# Patient Record
Sex: Female | Born: 1970
Health system: Southern US, Community
[De-identification: ages and names within clinical notes are randomized; demographics above are authoritative.]

## PROBLEM LIST (undated history)

## (undated) DIAGNOSIS — F419 Anxiety disorder, unspecified: Secondary | ICD-10-CM

## (undated) DIAGNOSIS — A630 Anogenital (venereal) warts: Secondary | ICD-10-CM

## (undated) DIAGNOSIS — R319 Hematuria, unspecified: Secondary | ICD-10-CM

## (undated) DIAGNOSIS — I2699 Other pulmonary embolism without acute cor pulmonale: Secondary | ICD-10-CM

## (undated) DIAGNOSIS — F32A Depression, unspecified: Secondary | ICD-10-CM

## (undated) DIAGNOSIS — F329 Major depressive disorder, single episode, unspecified: Secondary | ICD-10-CM

## (undated) HISTORY — PX: RIGHT OOPHORECTOMY: SHX2359

## (undated) HISTORY — PX: SALPINGECTOMY: SHX328

## (undated) HISTORY — DX: Anogenital (venereal) warts: A63.0

## (undated) HISTORY — PX: ABDOMINAL HYSTERECTOMY: SHX81

## (undated) HISTORY — DX: Major depressive disorder, single episode, unspecified: F32.9

## (undated) HISTORY — DX: Depression, unspecified: F32.A

## (undated) HISTORY — DX: Hematuria, unspecified: R31.9

## (undated) HISTORY — DX: Anxiety disorder, unspecified: F41.9

## (undated) HISTORY — DX: Other pulmonary embolism without acute cor pulmonale: I26.99

---
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
## 2005-04-04 HISTORY — PX: ABLATION ON ENDOMETRIOSIS: SHX5787

## 2005-06-03 ENCOUNTER — Ambulatory Visit: Payer: Self-pay | Admitting: Internal Medicine

## 2006-03-20 ENCOUNTER — Ambulatory Visit: Payer: Self-pay | Admitting: Obstetrics and Gynecology

## 2007-08-23 ENCOUNTER — Ambulatory Visit: Payer: Self-pay

## 2012-05-01 ENCOUNTER — Emergency Department: Payer: Self-pay | Admitting: Emergency Medicine

## 2012-05-01 LAB — CBC
HCT: 38.7 % (ref 35.0–47.0)
HGB: 13.3 g/dL (ref 12.0–16.0)
MCH: 33.9 pg (ref 26.0–34.0)
MCHC: 34.3 g/dL (ref 32.0–36.0)
MCV: 99 fL (ref 80–100)
Platelet: 319 10*3/uL (ref 150–440)
RBC: 3.93 10*6/uL (ref 3.80–5.20)
RDW: 13.7 % (ref 11.5–14.5)
WBC: 10.1 10*3/uL (ref 3.6–11.0)

## 2012-05-01 LAB — URINALYSIS, COMPLETE
Bilirubin,UR: NEGATIVE
Glucose,UR: NEGATIVE mg/dL (ref 0–75)
Ketone: NEGATIVE
Leukocyte Esterase: NEGATIVE
Nitrite: NEGATIVE
Ph: 6 (ref 4.5–8.0)
Protein: NEGATIVE
RBC,UR: 2 /HPF (ref 0–5)
Specific Gravity: 1.015 (ref 1.003–1.030)
Squamous Epithelial: 3
WBC UR: 2 /HPF (ref 0–5)

## 2012-05-01 LAB — COMPREHENSIVE METABOLIC PANEL
Albumin: 3.9 g/dL (ref 3.4–5.0)
Alkaline Phosphatase: 65 U/L (ref 50–136)
Anion Gap: 5 — ABNORMAL LOW (ref 7–16)
BUN: 9 mg/dL (ref 7–18)
Bilirubin,Total: 0.3 mg/dL (ref 0.2–1.0)
Calcium, Total: 8.7 mg/dL (ref 8.5–10.1)
Chloride: 109 mmol/L — ABNORMAL HIGH (ref 98–107)
Co2: 26 mmol/L (ref 21–32)
Creatinine: 0.67 mg/dL (ref 0.60–1.30)
EGFR (African American): >60
EGFR (Non-African Amer.): >60
Glucose: 95 mg/dL (ref 65–99)
Osmolality: 278 (ref 275–301)
Potassium: 3.7 mmol/L (ref 3.5–5.1)
SGOT(AST): 16 U/L (ref 15–37)
SGPT (ALT): 23 U/L (ref 12–78)
Sodium: 140 mmol/L (ref 136–145)
Total Protein: 7.2 g/dL (ref 6.4–8.2)

## 2012-05-01 LAB — DIFFERENTIAL
Basophil #: 0.1 10*3/uL (ref 0.0–0.1)
Basophil %: 0.5 %
Eosinophil #: 0.2 10*3/uL (ref 0.0–0.7)
Eosinophil %: 2.1 %
Lymphocyte #: 1.5 10*3/uL (ref 1.0–3.6)
Lymphocyte %: 14.8 %
Monocyte #: 0.7 x10 3/mm (ref 0.2–0.9)
Monocyte %: 7 %
Neutrophil #: 7.6 10*3/uL — ABNORMAL HIGH (ref 1.4–6.5)
Neutrophil %: 75.6 %

## 2012-05-01 LAB — HCG, QUANTITATIVE, PREGNANCY: Beta Hcg, Quant.: <1 m[IU]/mL — ABNORMAL LOW

## 2012-05-01 LAB — LIPASE, BLOOD: Lipase: 185 U/L (ref 73–393)

## 2012-10-04 ENCOUNTER — Ambulatory Visit: Payer: Self-pay | Admitting: Obstetrics and Gynecology

## 2012-10-04 LAB — BASIC METABOLIC PANEL
Anion Gap: 4 — ABNORMAL LOW (ref 7–16)
BUN: 16 mg/dL (ref 7–18)
Calcium, Total: 8.5 mg/dL (ref 8.5–10.1)
Chloride: 107 mmol/L (ref 98–107)
Co2: 28 mmol/L (ref 21–32)
Creatinine: 0.7 mg/dL (ref 0.60–1.30)
EGFR (African American): >60
EGFR (Non-African Amer.): >60
Glucose: 95 mg/dL (ref 65–99)
Osmolality: 279 (ref 275–301)
Potassium: 4.7 mmol/L (ref 3.5–5.1)
Sodium: 139 mmol/L (ref 136–145)

## 2012-10-04 LAB — CBC
HCT: 37.3 % (ref 35.0–47.0)
HGB: 13.2 g/dL (ref 12.0–16.0)
MCH: 34.3 pg — ABNORMAL HIGH (ref 26.0–34.0)
MCHC: 35.4 g/dL (ref 32.0–36.0)
MCV: 97 fL (ref 80–100)
Platelet: 240 10*3/uL (ref 150–440)
RBC: 3.84 10*6/uL (ref 3.80–5.20)
RDW: 13.9 % (ref 11.5–14.5)
WBC: 4.4 10*3/uL (ref 3.6–11.0)

## 2012-10-08 ENCOUNTER — Ambulatory Visit: Payer: Self-pay | Admitting: Obstetrics and Gynecology

## 2012-10-08 LAB — PROTIME-INR
INR: 1.1
Prothrombin Time: 14.2 secs (ref 11.5–14.7)

## 2012-10-10 LAB — PATHOLOGY REPORT

## 2013-06-04 ENCOUNTER — Emergency Department: Payer: Self-pay | Admitting: Emergency Medicine

## 2013-06-04 LAB — COMPREHENSIVE METABOLIC PANEL
Albumin: 4.1 g/dL (ref 3.4–5.0)
Alkaline Phosphatase: 68 U/L
Anion Gap: 4 — ABNORMAL LOW (ref 7–16)
BUN: 10 mg/dL (ref 7–18)
Bilirubin,Total: 0.3 mg/dL (ref 0.2–1.0)
Calcium, Total: 8.7 mg/dL (ref 8.5–10.1)
Chloride: 101 mmol/L (ref 98–107)
Co2: 30 mmol/L (ref 21–32)
Creatinine: 0.66 mg/dL (ref 0.60–1.30)
EGFR (African American): >60
EGFR (Non-African Amer.): >60
Glucose: 111 mg/dL — ABNORMAL HIGH (ref 65–99)
Osmolality: 270 (ref 275–301)
Potassium: 3.6 mmol/L (ref 3.5–5.1)
SGOT(AST): 27 U/L (ref 15–37)
SGPT (ALT): 32 U/L (ref 12–78)
Sodium: 135 mmol/L — ABNORMAL LOW (ref 136–145)
Total Protein: 7.6 g/dL (ref 6.4–8.2)

## 2013-06-04 LAB — CBC
HCT: 41.8 % (ref 35.0–47.0)
HGB: 14.2 g/dL (ref 12.0–16.0)
MCH: 34.2 pg — ABNORMAL HIGH (ref 26.0–34.0)
MCHC: 34.1 g/dL (ref 32.0–36.0)
MCV: 100 fL (ref 80–100)
Platelet: 323 10*3/uL (ref 150–440)
RBC: 4.16 10*6/uL (ref 3.80–5.20)
RDW: 13.8 % (ref 11.5–14.5)
WBC: 12.5 10*3/uL — ABNORMAL HIGH (ref 3.6–11.0)

## 2013-06-04 LAB — URINALYSIS, COMPLETE
Bilirubin,UR: NEGATIVE
Glucose,UR: NEGATIVE mg/dL (ref 0–75)
Leukocyte Esterase: NEGATIVE
Nitrite: NEGATIVE
Ph: 6 (ref 4.5–8.0)
Protein: NEGATIVE
RBC,UR: 374 /HPF (ref 0–5)
Specific Gravity: 1.015 (ref 1.003–1.030)
Squamous Epithelial: 1
WBC UR: 3 /HPF (ref 0–5)

## 2013-06-04 LAB — PROTIME-INR
INR: 1.8
Prothrombin Time: 20.3 secs — ABNORMAL HIGH (ref 11.5–14.7)

## 2014-02-06 LAB — HM PAP SMEAR: HM Pap smear: NEGATIVE

## 2014-04-04 HISTORY — PX: TOTAL ABDOMINAL HYSTERECTOMY: SHX209

## 2014-06-26 ENCOUNTER — Ambulatory Visit: Payer: Self-pay | Admitting: Obstetrics and Gynecology

## 2014-06-26 LAB — PROTIME-INR
INR: 1.3
Prothrombin Time: 16.2 secs — ABNORMAL HIGH

## 2014-06-26 LAB — CBC
HCT: 40.8 % (ref 35.0–47.0)
HGB: 13.4 g/dL (ref 12.0–16.0)
MCH: 32.2 pg (ref 26.0–34.0)
MCHC: 32.9 g/dL (ref 32.0–36.0)
MCV: 98 fL (ref 80–100)
Platelet: 265 10*3/uL (ref 150–440)
RBC: 4.16 10*6/uL (ref 3.80–5.20)
RDW: 13.9 % (ref 11.5–14.5)
WBC: 4 10*3/uL (ref 3.6–11.0)

## 2014-06-26 LAB — BASIC METABOLIC PANEL
Anion Gap: 6 — ABNORMAL LOW (ref 7–16)
BUN: 17 mg/dL
Calcium, Total: 9 mg/dL
Chloride: 106 mmol/L
Co2: 27 mmol/L
Creatinine: 0.54 mg/dL
EGFR (African American): >60
EGFR (Non-African Amer.): >60
Glucose: 88 mg/dL
Potassium: 3.8 mmol/L
Sodium: 139 mmol/L

## 2014-06-26 LAB — APTT: Activated PTT: 32.1 secs (ref 23.6–35.9)

## 2014-06-30 ENCOUNTER — Ambulatory Visit: Payer: Self-pay | Admitting: Obstetrics and Gynecology

## 2014-06-30 LAB — PROTIME-INR
INR: 1.1
Prothrombin Time: 14.7 secs

## 2014-07-01 LAB — CREATININE, SERUM
Creatinine: 0.55 mg/dL
EGFR (African American): >60
EGFR (Non-African Amer.): >60

## 2014-07-01 LAB — HEMOGLOBIN: HGB: 11 g/dL — ABNORMAL LOW (ref 12.0–16.0)

## 2014-07-25 NOTE — Op Note (Signed)
PATIENT NAME:  Latoya [name], Latoya [name] MR#:  [id number] DATE OF BIRTH:  08-08-1970  DATE OF PROCEDURE:  10/08/2012  PREOPERATIVE DIAGNOSES:  1. Chronic pelvic pain.  2. Status post tubal ligation and NovaSure endometrial ablation.  3. Abnormal uterine bleeding.   POSTOPERATIVE DIAGNOSES:  1. Chronic pelvic pain.  2. Status post tubal ligation and NovaSure endometrial ablation.  3. Abnormal uterine bleeding.  4. Suspect endometriosis.  5. Tubal ligation/ablation syndrome.   OPERATIVE PROCEDURE: Laparoscopy with peritoneal biopsies and fulguration of endometriosis.   SURGEON: Alanda Slim. DeFrancesco, M.D.   FIRST ASSISTANT: Herbert Moors, NP, and Shyrl Numbers, [place] student.   ANESTHESIA: General endotracheal.   INDICATIONS: The patient is a 44 year old, married, white female, multiparous, status post tubal ligation and NovaSure endometrial ablation who presents for evaluation of chronic pelvic pain and abnormal uterine bleeding. Workup the Emergency Room and as outpatient revealed free peritoneal fluid in the pelvis without other obvious abnormalities noted.   FINDINGS AT SURGERY: Revealed a gynecoid bony pelvis. There was a grossly normal-appearing uterus. There was proximal dilation of the fallopian tubes bilaterally, right greater than left. There was evidence of previous tubal ligation present with Falope-Rings being seen. The right tube and ovary had adhesions between it and the right pelvic sidewall; these were mobilized with blunt dissection. The left ovary was normal. The left fallopian tube had some paratubal cyst. Within the cul-de-sac there were pseudofenestrations on the right consistent with endometriosis.   DESCRIPTION OF PROCEDURE: The patient was brought to the operating room where she was placed in the supine position. General endotracheal anesthesia was induced without difficulty. She was placed in the dorsal lithotomy position using the bumblebee stirrups. A ChloraPrep and  Betadine abdominal, perineal, intravaginal prep and drape was performed in the standard fashion. A red Robinson catheter was used to drain minimal urine from the bladder. A Hulka tenaculum was placed onto the cervix to facilitate uterine manipulation. The bimanual exam did demonstrate a gynecoid bony pelvis with significant mobility of the uterus. A subumbilical vertical incision 5 mm in length was made. The Optiview laparoscopic trocar system was used to introduce the 5 mm scope into the abdomen through direct entry with no evidence of bowel or vascular injury. A second 5 mm port was placed in the suprapubic region under direct visualization. The above-noted findings were photo documented. The cul-de-sac peritoneal defect was biopsied. The right fallopian tube, proximally, was biopsied and bipolar Kleppinger cautery was used to fulgurate this area as well as in the cul-de-sac for treatment of suspected endometriosis. The pelvis was copiously irrigated. The irrigant fluid was aspirated. Once satisfied with final inspection for hemostasis and no residual peritoneal fluid being left behind, the procedure was terminated. All instrumentation was removed from the abdomen. Pneumoperitoneum was released. The incisions were closed with Dermabond glue. The patient was then awakened, extubated and taken to the recovery room in satisfactory condition.   ESTIMATED BLOOD LOSS: Minimal.   COMPLICATIONS: None.   All instrument, needle, sponge counts were verified as correct.    ____________________________ Alanda Slim. DeFrancesco, MD mad:gb D: 10/08/2012 16:27:13 ET T: 10/08/2012 22:52:30 ET JOB#: [id number]  cc: Hassell Done A. DeFrancesco, MD, <Dictator> [facility] MD ELECTRONICALLY SIGNED 10/14/2012 23:03

## 2014-07-28 LAB — SURGICAL PATHOLOGY

## 2014-08-03 NOTE — Op Note (Signed)
PATIENT NAME:  Latoya [name], Latoya [name] MR#:  [id number] DATE OF BIRTH:  1970-12-16  DATE OF PROCEDURE:  06/30/2014  PREOPERATIVE DIAGNOSES:  Symptomatic endometriosis.   POSTOPERATIVE DIAGNOSES:  1.  Symptomatic endometriosis.  2.  Pelvic adhesive disease.  3.  Bilateral ureteral efflux on cystoscopy.   OPERATIVE PROCEDURES:  1.  Laparoscopically assisted vaginal hysterectomy, right salpingo-oophorectomy and left salpingectomy.  2.  Cystoscopy.   SURGEON: Malachi Paradise, MD.    FIRST ASSISTANT: Chesley Noon. Marcelline Mates, MD.    ANESTHESIA: General endotracheal.   INDICATIONS: The patient is a 44 year old white female who presents for definitive surgery of symptomatic endometriosis. The patient is having increasing pelvic pain and abnormal bleeding. She desires definitive surgery.   FINDINGS AT SURGERY: Revealed adnexal adhesions between the tube, ovary, and uterus. The left ovary was normal. There were bladder flap adhesions to the lower uterine segment prompting difficult dissection.  There were pseudo-fenestrations  in the cul-de-sac consistent with endometriosis. The liver, gallbladder, appendix were all normal.   DESCRIPTION OF PROCEDURE: The patient was brought to the operating room where she was placed in the supine position. General endotracheal anesthesia was induced without difficulty. She was placed in the dorsal lithotomy position using the bumblebee stirrups. A ChloraPrep and Betadine abdominal, perineal, intravaginal prep and drape was performed in standard fashion. A Foley catheter was placed and was draining clear yellow urine from the bladder. A VCare uterine manipulator was inserted in standard fashion. Subumbilical incision was then made 5 mm in length. The Optiview laparoscopic trocar was used to place a port directly into the abdominopelvic cavity. Pneumoperitoneum was then created with CO2 gas. Two other 5 mm ports were placed in the lower abdomen under direct visualization. The  findings were photo documented. The abdominal aspect of the procedure was then performed. The round ligaments were clamped, coagulated, and cut. The right infundibulopelvic ligament was clamped, coagulated, and cut using the Ace Harmonic scalpel. The remainder of the mesosalpinx was likewise taken down using the Harmonic scalpel. The anterior and posterior leafs of the broad ligament were opened and dissected down to the uterosacral ligaments. The bladder flap was created in standard fashion. On the left side a left salpingectomy was performed with the Ace Harmonic scalpel with the incision line being made across the mesosalpinx. Once this was accomplished again dissection was made to identify the uterine arteries on the left side. The dissection over the bladder was complex because of adhesions likely from endometriosis. Moderate bleeding was encountered with this aspect of the procedure. Decision was then made to proceed with LAVH rather than [place] resident because of the difficult dissection. The attention was turned to the vagina and the [facility] instrument was removed from the uterus. The weighted speculum was placed into the vagina and a posterior colpotomy was made with Mayo scissors. The uterosacral ligaments were clamped, cut, and stick tied using 0 Vicryl suture. The cervix was circumscribed and the bladder was dissected off the lower uterine segment through sharp and blunt dissection. The anterior cul-de-sac was then entered. The remainder of the cardinal ligament complexes were then clamped, cut, and stick tied using 0 Vicryl suture. This ultimately mobilized the specimen, which was then removed from the vaginal cavity. The left broad ligament complex was slightly moist and a running stitch of 2-0 Vicryl was made in order to optimize hemostasis. The posterior cuff was run with 0 Vicryl in a simple running manner. This was followed by inspection of the pedicles which verified hemostasis. The  vagina was then  closed with simple interrupted sutures of 2-0 Chromic. Repeat laparoscopy was then performed to verify that the pedicle sites intra-abdominally were hemostatic. These were. The ureters were noted to have peristalsis on postoperative laparoscopy. However, because of scarring in the lower uterine segment and the bladder flap decision was made to perform cystoscopy. The 5 mm port incisions were closed with a 4-0 Vicryl suture. This was then followed by cystoscopy. A 30 degree scope was used. Lactated Ringer's was used to infuse into the bladder. Fluorescein 0.5 mg were given IV. Bilateral efflux of the left and right ureters were identified visually with normal peristalsis. No abnormalities were noted in the dome of the bladder. The procedure was then terminated. All instrumentation was removed from the vagina. A Foley catheter was replaced into the bladder to facilitate drainage. The patient was then awakened, mobilized, and taken to the recovery room in satisfactory condition.   ESTIMATED BLOOD LOSS: 500 mL.   INTRAVENOUS FLUIDS: 1800 mL.   COUNTS: All instrument, needle, and sponge counts were verified as correct.   The patient did subcutaneous DVT prophylaxis with heparin due to the patient's history of multifocal pulmonary emboli. She also received Ancef antibiotic prophylaxis.    ____________________________ Alanda Slim Darrian Goodwill, MD mad:bu D: 06/30/2014 11:16:15 ET T: 06/30/2014 21:26:32 ET JOB#: [id number]  cc: Hassell Done A. Jozlin Bently, MD, <Dictator> [facility] [place] MD ELECTRONICALLY SIGNED 07/03/2014 8:06

## 2014-10-15 ENCOUNTER — Ambulatory Visit: Payer: Self-pay | Admitting: Obstetrics and Gynecology

## 2014-10-17 ENCOUNTER — Other Ambulatory Visit: Payer: Self-pay | Admitting: Obstetrics and Gynecology

## 2014-10-17 DIAGNOSIS — N390 Urinary tract infection, site not specified: Secondary | ICD-10-CM

## 2014-10-17 MED ORDER — NITROFURANTOIN MONOHYD MACRO 100 MG PO CAPS: 100.0000 mg | ORAL_CAPSULE | Freq: Two times a day (BID) | ORAL | Status: DC

## 2014-10-17 MED ORDER — UROGESIC-BLUE 81.6 MG PO TABS: 1.0000 | ORAL_TABLET | Freq: Four times a day (QID) | ORAL | Status: DC | PRN

## 2014-10-17 NOTE — Telephone Encounter (Signed)
Pt aware I used [facility] (in [place] sys)  and I have erx the meds. Pt very appreciative.

## 2014-10-17 NOTE — Telephone Encounter (Signed)
Please let her know I printed off 2 prescriptions, one for antibiotic and the other for bladder pains- we need a pharmacy to fax to-

## 2014-10-17 NOTE — Telephone Encounter (Signed)
PT CALLED AND SHE IS IN THE BEGGING STAGES OF A UTI AND SHE CAN NOT COME IN TODAY BECAUSE OF WORK, SHE HAS DONE THE OVER THE COUNTER TEST STRIPES AND ITS SHOWING SHE IS HAVING A UTI, AND SHE HAS DRANK ABOUT A GALLON OF CRANBERRY JUICE, AND SHE IS TAKING THE AZZO, SHE IS LEAVE ING TONIGHT FOR [place] AND WANTS TO KNOW IF AN ANTIBIOTIC CAN BE CALLED IN FOR HER, SHE STATED SHE IS URINATING A LOT AND SOME BURNING.

## 2014-12-01 ENCOUNTER — Telehealth: Payer: Self-pay | Admitting: Obstetrics and Gynecology

## 2014-12-01 NOTE — Telephone Encounter (Signed)
Pt called and she needs a refill for vivance, she hasnt taken it in a week and her ADD is in over drive.

## 2014-12-02 ENCOUNTER — Other Ambulatory Visit: Payer: Self-pay | Admitting: Obstetrics and Gynecology

## 2014-12-02 MED ORDER — LISDEXAMFETAMINE DIMESYLATE 50 MG PO CAPS: 50.0000 mg | ORAL_CAPSULE | Freq: Every day | ORAL | Status: DC

## 2014-12-02 NOTE — Telephone Encounter (Signed)
Please let her know she can stop by and pick up RX

## 2014-12-02 NOTE — Telephone Encounter (Signed)
TRIED CALLING HER AND SHE DOES NOT HAVE VM SET UP.

## 2015-02-04 ENCOUNTER — Encounter: Payer: Self-pay | Admitting: *Deleted

## 2015-02-13 ENCOUNTER — Encounter: Payer: Self-pay | Admitting: Obstetrics and Gynecology

## 2015-03-06 ENCOUNTER — Other Ambulatory Visit: Payer: Self-pay | Admitting: Obstetrics and Gynecology

## 2015-03-06 MED ORDER — CEFDINIR 300 MG PO CAPS: 300.0000 mg | ORAL_CAPSULE | Freq: Two times a day (BID) | ORAL | Status: DC

## 2015-04-18 ENCOUNTER — Other Ambulatory Visit: Payer: Self-pay | Admitting: Obstetrics and Gynecology

## 2015-05-21 ENCOUNTER — Ambulatory Visit (INDEPENDENT_AMBULATORY_CARE_PROVIDER_SITE_OTHER): Payer: BLUE CROSS/BLUE SHIELD | Admitting: Obstetrics and Gynecology

## 2015-05-21 ENCOUNTER — Encounter: Payer: Self-pay | Admitting: Obstetrics and Gynecology

## 2015-05-21 VITALS — BP 133/84 | HR 90 | Ht 69.0 in | Wt 178.5 lb

## 2015-05-21 DIAGNOSIS — F988 Other specified behavioral and emotional disorders with onset usually occurring in childhood and adolescence: Secondary | ICD-10-CM

## 2015-05-21 DIAGNOSIS — A630 Anogenital (venereal) warts: Secondary | ICD-10-CM | POA: Diagnosis not present

## 2015-05-21 DIAGNOSIS — R102 Pelvic and perineal pain unspecified side: Secondary | ICD-10-CM

## 2015-05-21 DIAGNOSIS — Z86711 Personal history of pulmonary embolism: Secondary | ICD-10-CM | POA: Diagnosis not present

## 2015-05-21 DIAGNOSIS — F909 Attention-deficit hyperactivity disorder, unspecified type: Secondary | ICD-10-CM | POA: Diagnosis not present

## 2015-05-21 DIAGNOSIS — R5383 Other fatigue: Secondary | ICD-10-CM

## 2015-05-21 NOTE — Progress Notes (Signed)
Subjective:     Patient ID: Latoya [name], female   DOB: 1970-12-10, 45 y.o.   MRN: [id number]  HPI Reports gradual onset of LLQ pains c/w previous cyst and endometriosis pain, started  A few weeks ago and getting worse, now occuring daily, worse with activity. Also desires removal of rectal condyloma.  Review of Systems See above Progressive weight gain since hysterectomy and right oopherectomy last fall, with increasing exercise and improving diet- very frustrated. Also reports feeling very tired, emotional and moody, not sleeping well, and like hormones are 'off'.    Objective:   Physical Exam A&O x4  well groomed female in no distress, tearful when discussing concerns, spouse at bedside and supportive 2 small warts noted around rectum at 9 & 3 oclock- treated with TCA 80%.     Assessment:     LLQ pelvic pain-s/p hysterectomy & right ooperectomy Rectal condyloma Fatigue  weight gain Mood swings Decreased sleep quality     Plan:     TCA treatment done today Labs ordered- will return in am for lab draw Pelvic u/s and will treat accordingly. RTC 1 week  Aijalon Demuro Callender, [place]

## 2015-05-22 ENCOUNTER — Other Ambulatory Visit: Payer: BLUE CROSS/BLUE SHIELD

## 2015-05-22 ENCOUNTER — Other Ambulatory Visit: Payer: Self-pay | Admitting: Obstetrics and Gynecology

## 2015-05-23 LAB — CBC WITH DIFFERENTIAL/PLATELET
Basophils Absolute: 0.1 10*3/uL (ref 0.0–0.2)
Basos: 1 %
EOS (ABSOLUTE): 0.3 10*3/uL (ref 0.0–0.4)
Eos: 5 %
Hematocrit: 40.8 % (ref 34.0–46.6)
Hemoglobin: 13.9 g/dL (ref 11.1–15.9)
Immature Grans (Abs): 0 10*3/uL (ref 0.0–0.1)
Immature Granulocytes: 0 %
Lymphocytes Absolute: 2.7 10*3/uL (ref 0.7–3.1)
Lymphs: 54 %
MCH: 33.3 pg — ABNORMAL HIGH (ref 26.6–33.0)
MCHC: 34.1 g/dL (ref 31.5–35.7)
MCV: 98 fL — ABNORMAL HIGH (ref 79–97)
Monocytes Absolute: 0.4 10*3/uL (ref 0.1–0.9)
Monocytes: 9 %
Neutrophils Absolute: 1.5 10*3/uL (ref 1.4–7.0)
Neutrophils: 31 %
Platelets: 312 10*3/uL (ref 150–379)
RBC: 4.18 x10E6/uL (ref 3.77–5.28)
RDW: 14.7 % (ref 12.3–15.4)
WBC: 4.9 10*3/uL (ref 3.4–10.8)

## 2015-05-23 LAB — COMPREHENSIVE METABOLIC PANEL
ALT: 22 IU/L (ref 0–32)
AST: 22 IU/L (ref 0–40)
Albumin/Globulin Ratio: 1.8 (ref 1.1–2.5)
Albumin: 4.1 g/dL (ref 3.5–5.5)
Alkaline Phosphatase: 45 IU/L (ref 39–117)
BUN/Creatinine Ratio: 24 — ABNORMAL HIGH (ref 9–23)
BUN: 15 mg/dL (ref 6–24)
Bilirubin Total: 0.4 mg/dL (ref 0.0–1.2)
CO2: 24 mmol/L (ref 18–29)
Calcium: 8.6 mg/dL — ABNORMAL LOW (ref 8.7–10.2)
Chloride: 101 mmol/L (ref 96–106)
Creatinine, Ser: 0.62 mg/dL (ref 0.57–1.00)
GFR calc Af Amer: 126 mL/min/{1.73_m2} (ref >59)
GFR calc non Af Amer: 109 mL/min/{1.73_m2} (ref >59)
Globulin, Total: 2.3 g/dL (ref 1.5–4.5)
Glucose: 89 mg/dL (ref 65–99)
Potassium: 4.6 mmol/L (ref 3.5–5.2)
Sodium: 140 mmol/L (ref 134–144)
Total Protein: 6.4 g/dL (ref 6.0–8.5)

## 2015-05-23 LAB — PROTIME-INR
INR: 1.2 (ref 0.8–1.2)
Prothrombin Time: 12.6 s — ABNORMAL HIGH (ref 9.1–12.0)

## 2015-05-23 LAB — ESTRADIOL: Estradiol: <5 pg/mL

## 2015-05-23 LAB — PROGESTERONE: Progesterone: 0.4 ng/mL

## 2015-05-23 LAB — THYROID PANEL WITH TSH
Free Thyroxine Index: 1.7 (ref 1.2–4.9)
T3 Uptake Ratio: 33 % (ref 24–39)
T4, Total: 5.2 ug/dL (ref 4.5–12.0)
TSH: 3.23 u[IU]/mL (ref 0.450–4.500)

## 2015-05-26 ENCOUNTER — Ambulatory Visit (INDEPENDENT_AMBULATORY_CARE_PROVIDER_SITE_OTHER): Payer: BLUE CROSS/BLUE SHIELD | Admitting: Obstetrics and Gynecology

## 2015-05-26 ENCOUNTER — Other Ambulatory Visit: Payer: BLUE CROSS/BLUE SHIELD

## 2015-05-26 ENCOUNTER — Encounter: Payer: Self-pay | Admitting: Obstetrics and Gynecology

## 2015-05-26 ENCOUNTER — Ambulatory Visit (INDEPENDENT_AMBULATORY_CARE_PROVIDER_SITE_OTHER): Payer: BLUE CROSS/BLUE SHIELD

## 2015-05-26 VITALS — BP 128/65 | HR 85 | Ht 69.0 in | Wt 178.0 lb

## 2015-05-26 DIAGNOSIS — R635 Abnormal weight gain: Secondary | ICD-10-CM

## 2015-05-26 DIAGNOSIS — F419 Anxiety disorder, unspecified: Secondary | ICD-10-CM | POA: Insufficient documentation

## 2015-05-26 DIAGNOSIS — I2699 Other pulmonary embolism without acute cor pulmonale: Secondary | ICD-10-CM | POA: Insufficient documentation

## 2015-05-26 DIAGNOSIS — F32A Depression, unspecified: Secondary | ICD-10-CM | POA: Insufficient documentation

## 2015-05-26 DIAGNOSIS — F329 Major depressive disorder, single episode, unspecified: Secondary | ICD-10-CM | POA: Insufficient documentation

## 2015-05-26 DIAGNOSIS — R102 Pelvic and perineal pain: Secondary | ICD-10-CM

## 2015-05-26 DIAGNOSIS — E663 Overweight: Secondary | ICD-10-CM | POA: Diagnosis not present

## 2015-05-26 MED ORDER — PHENTERMINE HCL 37.5 MG PO TABS: 37.5000 mg | ORAL_TABLET | Freq: Every day | ORAL | Status: DC

## 2015-05-26 MED ORDER — CYANOCOBALAMIN 1000 MCG/ML IJ SOLN: 1000.0000 ug | Freq: Once | INTRAMUSCULAR | Status: DC

## 2015-05-26 MED ORDER — LISDEXAMFETAMINE DIMESYLATE 50 MG PO CAPS: 50.0000 mg | ORAL_CAPSULE | Freq: Every day | ORAL | Status: DC

## 2015-05-26 NOTE — Progress Notes (Signed)
Patient ID: CALYSSA YARDLEY, female   DOB: 1970/09/17, 45 y.o.   MRN: [id number]  Here to review labs and ultrasound findings. Current Outpatient Prescriptions on File Prior to Visit  Medication Sig Dispense Refill  . venlafaxine XR (EFFEXOR-XR) 150 MG 24 hr capsule Take 150 mg by mouth daily with breakfast.    . warfarin (COUMADIN) 10 MG tablet TAKE ONE TABLET BY MOUTH ONCE DAILY 30 tablet 0  . cefdinir (OMNICEF) 300 MG capsule Take 1 capsule (300 mg total) by mouth 2 (two) times daily. (Patient not taking: Reported on 05/21/2015) 14 capsule 1  . Methen-Hyosc-Meth Blue-Na Phos (UROGESIC-BLUE) 81.6 MG TABS Take 1 tablet (81.6 mg total) by mouth 4 (four) times daily as needed. (Patient not taking: Reported on 05/21/2015) 30 tablet 1  . nitrofurantoin, macrocrystal-monohydrate, (MACROBID) 100 MG capsule Take 1 capsule (100 mg total) by mouth 2 (two) times daily. (Patient not taking: Reported on 05/21/2015) 14 capsule 1   No current facility-administered medications on file prior to visit.  S/O;  Blood pressure 128/65, pulse 85, height 5\' 9"  (1.753 m), weight 178 lb (80.74 kg). Reports less pelvic pain than last week, did feel slight soreness during ultrasound.  Indications:Pelvic Pain Findings:  The uterus measures is surgically absent.  Right Ovary is surgically absent Left Ovary measures 3.3 x 1.6 x 2.4 cm. It is normal appearance. Survey of the adnexa demonstrates no adnexal masses. There is no free fluid in the cul de sac.  A: pelvic pain of unknown etiology, suspect adhesions Overweight Fatigue Coumadin therapy due to previous PE  P: reassured no know ovarian etiology, and discussed scar tissue and adhesions. Desires weight loss program- started on adipex and B12- RTC in 4 weeks and to continue exercise, but increase water intake. To contact PCP about INR and PTT levels for adjustment in coumadin dose.   Nikoletta Varma Garwood, CNM

## 2015-06-22 ENCOUNTER — Ambulatory Visit (INDEPENDENT_AMBULATORY_CARE_PROVIDER_SITE_OTHER): Payer: BLUE CROSS/BLUE SHIELD | Admitting: Obstetrics and Gynecology

## 2015-06-22 VITALS — BP 128/78 | HR 102 | Wt 176.0 lb

## 2015-06-22 DIAGNOSIS — E669 Obesity, unspecified: Secondary | ICD-10-CM

## 2015-06-22 MED ORDER — CYANOCOBALAMIN 1000 MCG/ML IJ SOLN
1000.0000 ug | Freq: Once | INTRAMUSCULAR | Status: AC
  Administered 2015-06-22: 1000 ug via INTRAMUSCULAR

## 2015-06-22 NOTE — Progress Notes (Signed)
Pt is here for wt, bp check,b-12 inj She states she is working out with trainer and doing boot camp 3 x a week, states medication gives her dry mouth, she has increased her water intake, slightly frustrated about not losing but 2lbs Advised pt she is gaining muscle with all the working out she is doing   05/26/15 wt- 178lb

## 2015-07-08 DIAGNOSIS — J1089 Influenza due to other identified influenza virus with other manifestations: Secondary | ICD-10-CM | POA: Diagnosis not present

## 2015-07-08 DIAGNOSIS — J069 Acute upper respiratory infection, unspecified: Secondary | ICD-10-CM | POA: Diagnosis not present

## 2015-07-08 DIAGNOSIS — R000 Tachycardia, unspecified: Secondary | ICD-10-CM | POA: Diagnosis not present

## 2015-07-08 DIAGNOSIS — J020 Streptococcal pharyngitis: Secondary | ICD-10-CM | POA: Diagnosis not present

## 2015-07-20 ENCOUNTER — Ambulatory Visit: Payer: BLUE CROSS/BLUE SHIELD

## 2015-07-21 ENCOUNTER — Ambulatory Visit (INDEPENDENT_AMBULATORY_CARE_PROVIDER_SITE_OTHER): Payer: BLUE CROSS/BLUE SHIELD | Admitting: Obstetrics and Gynecology

## 2015-07-21 VITALS — BP 120/82 | HR 94 | Wt 172.4 lb

## 2015-07-21 DIAGNOSIS — E669 Obesity, unspecified: Secondary | ICD-10-CM | POA: Diagnosis not present

## 2015-07-21 MED ORDER — CYANOCOBALAMIN 1000 MCG/ML IJ SOLN
1000.0000 ug | Freq: Once | INTRAMUSCULAR | Status: AC
  Administered 2015-07-21: 1000 ug via INTRAMUSCULAR

## 2015-07-21 NOTE — Progress Notes (Signed)
Patient ID: Latoya [name], female   DOB: September 08, 1970, 45 y.o.   MRN: [id number] Pt presents for weight, B/P, B-12 injection. No side effects of medication-Phentermine, or B-12.  Weight loss of  3 lbs. Encouraged eating healthy and exercise.

## 2015-08-30 ENCOUNTER — Other Ambulatory Visit: Payer: Self-pay | Admitting: Obstetrics and Gynecology

## 2015-09-27 ENCOUNTER — Other Ambulatory Visit: Payer: Self-pay | Admitting: Obstetrics and Gynecology

## 2015-09-30 DIAGNOSIS — M791 Myalgia: Secondary | ICD-10-CM | POA: Diagnosis not present

## 2015-09-30 DIAGNOSIS — M9905 Segmental and somatic dysfunction of pelvic region: Secondary | ICD-10-CM | POA: Diagnosis not present

## 2015-09-30 DIAGNOSIS — M5430 Sciatica, unspecified side: Secondary | ICD-10-CM | POA: Diagnosis not present

## 2015-09-30 DIAGNOSIS — M9901 Segmental and somatic dysfunction of cervical region: Secondary | ICD-10-CM | POA: Diagnosis not present

## 2015-10-02 DIAGNOSIS — M791 Myalgia: Secondary | ICD-10-CM | POA: Diagnosis not present

## 2015-10-02 DIAGNOSIS — M9901 Segmental and somatic dysfunction of cervical region: Secondary | ICD-10-CM | POA: Diagnosis not present

## 2015-10-02 DIAGNOSIS — M722 Plantar fascial fibromatosis: Secondary | ICD-10-CM | POA: Diagnosis not present

## 2015-10-02 DIAGNOSIS — M9905 Segmental and somatic dysfunction of pelvic region: Secondary | ICD-10-CM | POA: Diagnosis not present

## 2015-10-14 DIAGNOSIS — M722 Plantar fascial fibromatosis: Secondary | ICD-10-CM | POA: Diagnosis not present

## 2015-10-14 DIAGNOSIS — M9901 Segmental and somatic dysfunction of cervical region: Secondary | ICD-10-CM | POA: Diagnosis not present

## 2015-10-14 DIAGNOSIS — M9905 Segmental and somatic dysfunction of pelvic region: Secondary | ICD-10-CM | POA: Diagnosis not present

## 2015-10-14 DIAGNOSIS — M791 Myalgia: Secondary | ICD-10-CM | POA: Diagnosis not present

## 2015-10-16 DIAGNOSIS — M9905 Segmental and somatic dysfunction of pelvic region: Secondary | ICD-10-CM | POA: Diagnosis not present

## 2015-10-16 DIAGNOSIS — M722 Plantar fascial fibromatosis: Secondary | ICD-10-CM | POA: Diagnosis not present

## 2015-10-16 DIAGNOSIS — M9901 Segmental and somatic dysfunction of cervical region: Secondary | ICD-10-CM | POA: Diagnosis not present

## 2015-10-16 DIAGNOSIS — M791 Myalgia: Secondary | ICD-10-CM | POA: Diagnosis not present

## 2015-10-19 DIAGNOSIS — M791 Myalgia: Secondary | ICD-10-CM | POA: Diagnosis not present

## 2015-10-19 DIAGNOSIS — M722 Plantar fascial fibromatosis: Secondary | ICD-10-CM | POA: Diagnosis not present

## 2015-10-19 DIAGNOSIS — M9905 Segmental and somatic dysfunction of pelvic region: Secondary | ICD-10-CM | POA: Diagnosis not present

## 2015-10-19 DIAGNOSIS — M9901 Segmental and somatic dysfunction of cervical region: Secondary | ICD-10-CM | POA: Diagnosis not present

## 2015-10-21 DIAGNOSIS — M722 Plantar fascial fibromatosis: Secondary | ICD-10-CM | POA: Diagnosis not present

## 2015-10-21 DIAGNOSIS — M9905 Segmental and somatic dysfunction of pelvic region: Secondary | ICD-10-CM | POA: Diagnosis not present

## 2015-10-21 DIAGNOSIS — M9901 Segmental and somatic dysfunction of cervical region: Secondary | ICD-10-CM | POA: Diagnosis not present

## 2015-10-21 DIAGNOSIS — M791 Myalgia: Secondary | ICD-10-CM | POA: Diagnosis not present

## 2015-10-23 DIAGNOSIS — M9901 Segmental and somatic dysfunction of cervical region: Secondary | ICD-10-CM | POA: Diagnosis not present

## 2015-10-23 DIAGNOSIS — M9905 Segmental and somatic dysfunction of pelvic region: Secondary | ICD-10-CM | POA: Diagnosis not present

## 2015-10-23 DIAGNOSIS — M722 Plantar fascial fibromatosis: Secondary | ICD-10-CM | POA: Diagnosis not present

## 2015-10-23 DIAGNOSIS — M791 Myalgia: Secondary | ICD-10-CM | POA: Diagnosis not present

## 2015-10-26 DIAGNOSIS — M9901 Segmental and somatic dysfunction of cervical region: Secondary | ICD-10-CM | POA: Diagnosis not present

## 2015-10-26 DIAGNOSIS — M791 Myalgia: Secondary | ICD-10-CM | POA: Diagnosis not present

## 2015-10-26 DIAGNOSIS — M722 Plantar fascial fibromatosis: Secondary | ICD-10-CM | POA: Diagnosis not present

## 2015-10-26 DIAGNOSIS — M9905 Segmental and somatic dysfunction of pelvic region: Secondary | ICD-10-CM | POA: Diagnosis not present

## 2015-11-02 DIAGNOSIS — M791 Myalgia: Secondary | ICD-10-CM | POA: Diagnosis not present

## 2015-11-02 DIAGNOSIS — M9905 Segmental and somatic dysfunction of pelvic region: Secondary | ICD-10-CM | POA: Diagnosis not present

## 2015-11-02 DIAGNOSIS — M9901 Segmental and somatic dysfunction of cervical region: Secondary | ICD-10-CM | POA: Diagnosis not present

## 2015-11-02 DIAGNOSIS — M722 Plantar fascial fibromatosis: Secondary | ICD-10-CM | POA: Diagnosis not present

## 2015-11-04 DIAGNOSIS — M9905 Segmental and somatic dysfunction of pelvic region: Secondary | ICD-10-CM | POA: Diagnosis not present

## 2015-11-04 DIAGNOSIS — M791 Myalgia: Secondary | ICD-10-CM | POA: Diagnosis not present

## 2015-11-04 DIAGNOSIS — M9901 Segmental and somatic dysfunction of cervical region: Secondary | ICD-10-CM | POA: Diagnosis not present

## 2015-11-04 DIAGNOSIS — M722 Plantar fascial fibromatosis: Secondary | ICD-10-CM | POA: Diagnosis not present

## 2015-11-06 DIAGNOSIS — M722 Plantar fascial fibromatosis: Secondary | ICD-10-CM | POA: Diagnosis not present

## 2015-11-06 DIAGNOSIS — M9905 Segmental and somatic dysfunction of pelvic region: Secondary | ICD-10-CM | POA: Diagnosis not present

## 2015-11-06 DIAGNOSIS — M791 Myalgia: Secondary | ICD-10-CM | POA: Diagnosis not present

## 2015-11-06 DIAGNOSIS — M9901 Segmental and somatic dysfunction of cervical region: Secondary | ICD-10-CM | POA: Diagnosis not present

## 2015-11-09 DIAGNOSIS — M722 Plantar fascial fibromatosis: Secondary | ICD-10-CM | POA: Diagnosis not present

## 2015-11-09 DIAGNOSIS — M9905 Segmental and somatic dysfunction of pelvic region: Secondary | ICD-10-CM | POA: Diagnosis not present

## 2015-11-09 DIAGNOSIS — M791 Myalgia: Secondary | ICD-10-CM | POA: Diagnosis not present

## 2015-11-09 DIAGNOSIS — M9901 Segmental and somatic dysfunction of cervical region: Secondary | ICD-10-CM | POA: Diagnosis not present

## 2015-11-13 DIAGNOSIS — M791 Myalgia: Secondary | ICD-10-CM | POA: Diagnosis not present

## 2015-11-13 DIAGNOSIS — M9905 Segmental and somatic dysfunction of pelvic region: Secondary | ICD-10-CM | POA: Diagnosis not present

## 2015-11-13 DIAGNOSIS — M722 Plantar fascial fibromatosis: Secondary | ICD-10-CM | POA: Diagnosis not present

## 2015-11-13 DIAGNOSIS — M9901 Segmental and somatic dysfunction of cervical region: Secondary | ICD-10-CM | POA: Diagnosis not present

## 2015-11-16 DIAGNOSIS — M9905 Segmental and somatic dysfunction of pelvic region: Secondary | ICD-10-CM | POA: Diagnosis not present

## 2015-11-16 DIAGNOSIS — M791 Myalgia: Secondary | ICD-10-CM | POA: Diagnosis not present

## 2015-11-16 DIAGNOSIS — M9901 Segmental and somatic dysfunction of cervical region: Secondary | ICD-10-CM | POA: Diagnosis not present

## 2015-11-16 DIAGNOSIS — M722 Plantar fascial fibromatosis: Secondary | ICD-10-CM | POA: Diagnosis not present

## 2015-11-23 DIAGNOSIS — M791 Myalgia: Secondary | ICD-10-CM | POA: Diagnosis not present

## 2015-11-23 DIAGNOSIS — M722 Plantar fascial fibromatosis: Secondary | ICD-10-CM | POA: Diagnosis not present

## 2015-11-23 DIAGNOSIS — M9905 Segmental and somatic dysfunction of pelvic region: Secondary | ICD-10-CM | POA: Diagnosis not present

## 2015-11-23 DIAGNOSIS — M9901 Segmental and somatic dysfunction of cervical region: Secondary | ICD-10-CM | POA: Diagnosis not present

## 2015-11-25 DIAGNOSIS — M9901 Segmental and somatic dysfunction of cervical region: Secondary | ICD-10-CM | POA: Diagnosis not present

## 2015-11-25 DIAGNOSIS — M791 Myalgia: Secondary | ICD-10-CM | POA: Diagnosis not present

## 2015-11-25 DIAGNOSIS — M9905 Segmental and somatic dysfunction of pelvic region: Secondary | ICD-10-CM | POA: Diagnosis not present

## 2015-11-25 DIAGNOSIS — M722 Plantar fascial fibromatosis: Secondary | ICD-10-CM | POA: Diagnosis not present

## 2015-11-30 DIAGNOSIS — M722 Plantar fascial fibromatosis: Secondary | ICD-10-CM | POA: Diagnosis not present

## 2015-11-30 DIAGNOSIS — M9905 Segmental and somatic dysfunction of pelvic region: Secondary | ICD-10-CM | POA: Diagnosis not present

## 2015-11-30 DIAGNOSIS — M9901 Segmental and somatic dysfunction of cervical region: Secondary | ICD-10-CM | POA: Diagnosis not present

## 2015-11-30 DIAGNOSIS — M791 Myalgia: Secondary | ICD-10-CM | POA: Diagnosis not present

## 2015-12-09 DIAGNOSIS — M791 Myalgia: Secondary | ICD-10-CM | POA: Diagnosis not present

## 2015-12-09 DIAGNOSIS — M9905 Segmental and somatic dysfunction of pelvic region: Secondary | ICD-10-CM | POA: Diagnosis not present

## 2015-12-09 DIAGNOSIS — M722 Plantar fascial fibromatosis: Secondary | ICD-10-CM | POA: Diagnosis not present

## 2015-12-09 DIAGNOSIS — M9901 Segmental and somatic dysfunction of cervical region: Secondary | ICD-10-CM | POA: Diagnosis not present

## 2015-12-11 DIAGNOSIS — M791 Myalgia: Secondary | ICD-10-CM | POA: Diagnosis not present

## 2015-12-11 DIAGNOSIS — M9901 Segmental and somatic dysfunction of cervical region: Secondary | ICD-10-CM | POA: Diagnosis not present

## 2015-12-11 DIAGNOSIS — M9905 Segmental and somatic dysfunction of pelvic region: Secondary | ICD-10-CM | POA: Diagnosis not present

## 2015-12-11 DIAGNOSIS — M722 Plantar fascial fibromatosis: Secondary | ICD-10-CM | POA: Diagnosis not present

## 2015-12-14 DIAGNOSIS — M791 Myalgia: Secondary | ICD-10-CM | POA: Diagnosis not present

## 2015-12-14 DIAGNOSIS — M9905 Segmental and somatic dysfunction of pelvic region: Secondary | ICD-10-CM | POA: Diagnosis not present

## 2015-12-14 DIAGNOSIS — M9901 Segmental and somatic dysfunction of cervical region: Secondary | ICD-10-CM | POA: Diagnosis not present

## 2015-12-14 DIAGNOSIS — M722 Plantar fascial fibromatosis: Secondary | ICD-10-CM | POA: Diagnosis not present

## 2015-12-23 ENCOUNTER — Other Ambulatory Visit: Payer: Self-pay | Admitting: Obstetrics and Gynecology

## 2015-12-23 MED ORDER — LISDEXAMFETAMINE DIMESYLATE 50 MG PO CAPS: 50.0000 mg | ORAL_CAPSULE | Freq: Every day | ORAL | 0 refills | Status: AC

## 2015-12-30 DIAGNOSIS — M47817 Spondylosis without myelopathy or radiculopathy, lumbosacral region: Secondary | ICD-10-CM | POA: Diagnosis not present

## 2015-12-30 DIAGNOSIS — M5126 Other intervertebral disc displacement, lumbar region: Secondary | ICD-10-CM | POA: Diagnosis not present

## 2015-12-30 DIAGNOSIS — M5127 Other intervertebral disc displacement, lumbosacral region: Secondary | ICD-10-CM | POA: Diagnosis not present

## 2015-12-30 DIAGNOSIS — M47816 Spondylosis without myelopathy or radiculopathy, lumbar region: Secondary | ICD-10-CM | POA: Diagnosis not present

## 2015-12-31 DIAGNOSIS — M5440 Lumbago with sciatica, unspecified side: Secondary | ICD-10-CM | POA: Diagnosis not present

## 2016-01-02 ENCOUNTER — Other Ambulatory Visit: Payer: Self-pay | Admitting: Obstetrics and Gynecology

## 2016-01-05 NOTE — Telephone Encounter (Signed)
Please advise 

## 2016-01-07 DIAGNOSIS — I2699 Other pulmonary embolism without acute cor pulmonale: Secondary | ICD-10-CM | POA: Diagnosis not present

## 2016-01-07 DIAGNOSIS — M5440 Lumbago with sciatica, unspecified side: Secondary | ICD-10-CM | POA: Diagnosis not present

## 2016-01-12 DIAGNOSIS — M549 Dorsalgia, unspecified: Secondary | ICD-10-CM | POA: Diagnosis not present

## 2016-01-12 DIAGNOSIS — M545 Low back pain: Secondary | ICD-10-CM | POA: Diagnosis not present

## 2016-01-12 DIAGNOSIS — M4726 Other spondylosis with radiculopathy, lumbar region: Secondary | ICD-10-CM | POA: Diagnosis not present

## 2016-02-02 DIAGNOSIS — M5440 Lumbago with sciatica, unspecified side: Secondary | ICD-10-CM | POA: Diagnosis not present

## 2016-02-02 DIAGNOSIS — I2699 Other pulmonary embolism without acute cor pulmonale: Secondary | ICD-10-CM | POA: Diagnosis not present

## 2016-02-16 DIAGNOSIS — I2699 Other pulmonary embolism without acute cor pulmonale: Secondary | ICD-10-CM | POA: Diagnosis not present

## 2016-02-16 DIAGNOSIS — D684 Acquired coagulation factor deficiency: Secondary | ICD-10-CM | POA: Diagnosis not present

## 2016-03-21 DIAGNOSIS — I2699 Other pulmonary embolism without acute cor pulmonale: Secondary | ICD-10-CM | POA: Diagnosis not present

## 2016-03-21 DIAGNOSIS — D684 Acquired coagulation factor deficiency: Secondary | ICD-10-CM | POA: Diagnosis not present

## 2016-03-21 DIAGNOSIS — M5440 Lumbago with sciatica, unspecified side: Secondary | ICD-10-CM | POA: Diagnosis not present

## 2016-05-24 DIAGNOSIS — M5440 Lumbago with sciatica, unspecified side: Secondary | ICD-10-CM | POA: Diagnosis not present

## 2016-05-24 DIAGNOSIS — I2699 Other pulmonary embolism without acute cor pulmonale: Secondary | ICD-10-CM | POA: Diagnosis not present

## 2016-05-24 DIAGNOSIS — F988 Other specified behavioral and emotional disorders with onset usually occurring in childhood and adolescence: Secondary | ICD-10-CM | POA: Diagnosis not present

## 2016-05-24 DIAGNOSIS — D684 Acquired coagulation factor deficiency: Secondary | ICD-10-CM | POA: Diagnosis not present

## 2016-07-21 ENCOUNTER — Telehealth: Payer: Self-pay

## 2016-07-21 ENCOUNTER — Other Ambulatory Visit: Payer: Self-pay

## 2016-07-21 DIAGNOSIS — Z1211 Encounter for screening for malignant neoplasm of colon: Secondary | ICD-10-CM

## 2016-07-22 ENCOUNTER — Telehealth: Payer: Self-pay | Admitting: Gastroenterology

## 2016-07-22 ENCOUNTER — Other Ambulatory Visit: Payer: Self-pay

## 2016-07-22 NOTE — Telephone Encounter (Signed)
Gastroenterology Pre-Procedure Review  Request Date:  Requesting Physician: Dr.  PATIENT REVIEW QUESTIONS: The patient responded to the following health history questions as indicated:    1. Are you having any GI issues? yes (yes) 2. Do you have a personal history of Polyps? no 3. Do you have a family history of Colon Cancer or Polyps? yes (dad-polpy) 4. Diabetes Mellitus? no 5. Joint replacements in the past 12 months?no 6. Major health problems in the past 3 months?no 7. Any artificial heart valves, MVP, or defibrillator?no    MEDICATIONS & ALLERGIES:    Patient reports the following regarding taking any anticoagulation/antiplatelet therapy:   Plavix, Coumadin, Eliquis, Xarelto, Lovenox, Pradaxa, Brilinta, or Effient? yes (coumadin) Aspirin? no  Patient confirms/reports the following medications:  Current Outpatient Prescriptions  Medication Sig Dispense Refill  . cyanocobalamin (,VITAMIN B-12,) 1000 MCG/ML injection Inject 1 mL (1,000 mcg total) into the muscle once. 3 mL 1  . lisdexamfetamine (VYVANSE) 50 MG capsule Take 1 capsule (50 mg total) by mouth daily. 30 capsule 0  . Methen-Hyosc-Meth Blue-Na Phos (UROGESIC-BLUE) 81.6 MG TABS Take 1 tablet (81.6 mg total) by mouth 4 (four) times daily as needed. (Patient not taking: Reported on 05/21/2015) 30 tablet 1  . nitrofurantoin, macrocrystal-monohydrate, (MACROBID) 100 MG capsule Take 1 capsule (100 mg total) by mouth 2 (two) times daily. (Patient not taking: Reported on 05/21/2015) 14 capsule 1  . phentermine (ADIPEX-P) 37.5 MG tablet Take 1 tablet (37.5 mg total) by mouth daily before breakfast. 30 tablet 2  . sertraline (ZOLOFT) 50 MG tablet     . traZODone (DESYREL) 50 MG tablet     . Venlafaxine HCl 75 MG TB24     . venlafaxine XR (EFFEXOR-XR) 150 MG 24 hr capsule     . warfarin (COUMADIN) 10 MG tablet TAKE ONE TABLET BY MOUTH ONCE DAILY 30 tablet 0  . warfarin (COUMADIN) 10 MG tablet TAKE ONE TABLET BY MOUTH ONCE DAILY 30  tablet 410  . warfarin (COUMADIN) 5 MG tablet      No current facility-administered medications for this visit.     Patient confirms/reports the following allergies:  Allergies  Allergen Reactions  . Biaxin [Clarithromycin] Nausea Only    No orders of the defined types were placed in this encounter.   AUTHORIZATION INFORMATION Primary Insurance: 1D#: Group #:  Secondary Insurance: 1D#: Group #:  SCHEDULE INFORMATION: Date: 08/09/16 Time: Location: [place]

## 2016-07-22 NOTE — Telephone Encounter (Signed)
07/22/16 Spoke with Elmyra Ricks at [facility] and NO prior auth required for Colonoscopy [phone] / Z12.11 (ref# [id number]).

## 2016-07-27 ENCOUNTER — Telehealth: Payer: Self-pay

## 2016-07-27 ENCOUNTER — Other Ambulatory Visit: Payer: Self-pay

## 2016-07-27 NOTE — Telephone Encounter (Signed)
Patient has been left a message to contact office for instructions on stopping coumadin prior to colonoscopy.

## 2016-08-09 ENCOUNTER — Encounter
Admission: RE | Disposition: A | Discharge status: Home / Self Care | Payer: Self-pay | Source: Ambulatory Visit | Attending: Gastroenterology

## 2016-08-09 ENCOUNTER — Encounter: Payer: Self-pay | Admitting: *Deleted

## 2016-08-09 ENCOUNTER — Ambulatory Visit: Payer: BLUE CROSS/BLUE SHIELD | Admitting: Certified Registered Nurse Anesthetist

## 2016-08-09 ENCOUNTER — Ambulatory Visit
Admission: RE | Admit: 2016-08-09 | Discharge: 2016-08-09 | Disposition: A | Discharge status: Home / Self Care | Payer: BLUE CROSS/BLUE SHIELD | Source: Ambulatory Visit | Attending: Gastroenterology | Admitting: Gastroenterology

## 2016-08-09 DIAGNOSIS — F419 Anxiety disorder, unspecified: Secondary | ICD-10-CM | POA: Insufficient documentation

## 2016-08-09 DIAGNOSIS — D124 Benign neoplasm of descending colon: Secondary | ICD-10-CM | POA: Diagnosis not present

## 2016-08-09 DIAGNOSIS — Z86711 Personal history of pulmonary embolism: Secondary | ICD-10-CM | POA: Diagnosis not present

## 2016-08-09 DIAGNOSIS — Z79899 Other long term (current) drug therapy: Secondary | ICD-10-CM | POA: Insufficient documentation

## 2016-08-09 DIAGNOSIS — K635 Polyp of colon: Secondary | ICD-10-CM | POA: Diagnosis not present

## 2016-08-09 DIAGNOSIS — K640 First degree hemorrhoids: Secondary | ICD-10-CM | POA: Diagnosis not present

## 2016-08-09 DIAGNOSIS — K649 Unspecified hemorrhoids: Secondary | ICD-10-CM | POA: Diagnosis not present

## 2016-08-09 DIAGNOSIS — Z1211 Encounter for screening for malignant neoplasm of colon: Secondary | ICD-10-CM | POA: Diagnosis not present

## 2016-08-09 DIAGNOSIS — F17200 Nicotine dependence, unspecified, uncomplicated: Secondary | ICD-10-CM | POA: Insufficient documentation

## 2016-08-09 DIAGNOSIS — Z7901 Long term (current) use of anticoagulants: Secondary | ICD-10-CM | POA: Insufficient documentation

## 2016-08-09 DIAGNOSIS — R197 Diarrhea, unspecified: Secondary | ICD-10-CM | POA: Diagnosis not present

## 2016-08-09 DIAGNOSIS — F329 Major depressive disorder, single episode, unspecified: Secondary | ICD-10-CM | POA: Insufficient documentation

## 2016-08-09 DIAGNOSIS — F418 Other specified anxiety disorders: Secondary | ICD-10-CM | POA: Diagnosis not present

## 2016-08-09 HISTORY — PX: COLONOSCOPY WITH PROPOFOL: SHX5780

## 2016-08-09 LAB — PROTIME-INR
INR: 1.29
Prothrombin Time: 16.2 seconds — ABNORMAL HIGH (ref 11.4–15.2)

## 2016-08-09 SURGERY — COLONOSCOPY WITH PROPOFOL
Anesthesia: General

## 2016-08-09 MED ORDER — FENTANYL CITRATE (PF) 100 MCG/2ML IJ SOLN
INTRAMUSCULAR | Status: AC
  Filled 2016-08-09: qty 2

## 2016-08-09 MED ORDER — MIDAZOLAM HCL 5 MG/5ML IJ SOLN
INTRAMUSCULAR | Status: DC | PRN
  Administered 2016-08-09: 2 mg via INTRAVENOUS

## 2016-08-09 MED ORDER — MIDAZOLAM HCL 2 MG/2ML IJ SOLN
INTRAMUSCULAR | Status: AC
  Filled 2016-08-09: qty 2

## 2016-08-09 MED ORDER — PROPOFOL 500 MG/50ML IV EMUL
INTRAVENOUS | Status: AC
  Filled 2016-08-09: qty 50

## 2016-08-09 MED ORDER — PROPOFOL 500 MG/50ML IV EMUL
INTRAVENOUS | Status: DC | PRN
  Administered 2016-08-09: 150 ug/kg/min via INTRAVENOUS

## 2016-08-09 MED ORDER — SODIUM CHLORIDE 0.9 % IV SOLN
INTRAVENOUS | Status: DC
  Administered 2016-08-09: 11:00:00 via INTRAVENOUS

## 2016-08-09 MED ORDER — PROPOFOL 10 MG/ML IV BOLUS
INTRAVENOUS | Status: DC | PRN
Start: 2016-08-09 — End: 2016-08-09
  Administered 2016-08-09: 50 mg via INTRAVENOUS
  Administered 2016-08-09: 30 mg via INTRAVENOUS

## 2016-08-09 MED ORDER — FENTANYL CITRATE (PF) 100 MCG/2ML IJ SOLN
INTRAMUSCULAR | Status: DC | PRN
  Administered 2016-08-09 (×2): 50 ug via INTRAVENOUS

## 2016-08-09 NOTE — Transfer of Care (Signed)
Immediate Anesthesia Transfer of Care Note  Patient: Latoya [name]  Procedure(s) Performed: Procedure(s): COLONOSCOPY WITH PROPOFOL (N/A)  Patient Location: PACU and Endoscopy Unit  Anesthesia Type:General  Level of Consciousness: awake, alert  and oriented  Airway & Oxygen Therapy: Patient Spontanous Breathing and Patient connected to nasal cannula oxygen  Post-op Assessment: Report given to RN and Post -op Vital signs reviewed and stable  Post vital signs: Reviewed and stable  Last Vitals:  Vitals:   08/09/16 1237 08/09/16 1238  BP:    Pulse:    Resp:    Temp: 36.2 C 36.2 C    Last Pain:  Vitals:   08/09/16 1238  TempSrc: Tympanic      Patients Stated Pain Goal: 0 (56/97/94 8016)  Complications: No apparent anesthesia complications

## 2016-08-09 NOTE — Op Note (Signed)
[facility] Gastroenterology Patient Name: Latoya [name] Procedure Date: 08/09/2016 11:11 AM MRN: [id number] Account #: [id number] Date of Birth: 1970/10/21 Admit Type: Outpatient Age: 46 Room: [facility] ENDO ROOM 1 Gender: Female Note Status: Finalized Procedure:            Colonoscopy Indications:          Clinically significant diarrhea of unexplained origin Providers:            Jonathon Bellows MD, MD Referring MD:         Cletis Athens, MD (Referring MD) Medicines:            Monitored Anesthesia Care Complications:        No immediate complications. Procedure:            Pre-Anesthesia Assessment:                       - Prior to the procedure, a History and Physical was                        performed, and patient medications, allergies and                        sensitivities were reviewed. The patient's tolerance of                        previous anesthesia was reviewed.                       - The risks and benefits of the procedure and the                        sedation options and risks were discussed with the                        patient. All questions were answered and informed                        consent was obtained.                       - ASA Grade Assessment: II - A patient with mild                        systemic disease.                       After obtaining informed consent, the colonoscope was                        passed under direct vision. Throughout the procedure,                        the patient's blood pressure, pulse, and oxygen                        saturations were monitored continuously. The                        Colonoscope was introduced through the anus and  advanced to the the terminal ileum. The colonoscopy was                        performed with ease. The patient tolerated the                        procedure well. The quality of the bowel preparation                        was good. Findings:       A 3 mm polyp was found in the descending colon. The polyp was sessile.       The polyp was removed with a cold biopsy forceps. Resection and       retrieval were complete.      Normal mucosa was found in the entire colon. Biopsies for histology were       taken with a cold forceps from the right colon and left colon for       evaluation of microscopic colitis.      The exam was otherwise without abnormality on direct and retroflexion       views. Impression:           - One 3 mm polyp in the descending colon, removed with                        a cold biopsy forceps. Resected and retrieved.                       - Normal mucosa in the entire examined colon. Biopsied.                       - The examination was otherwise normal on direct and                        retroflexion views. Recommendation:       - Discharge patient to home (with escort).                       - Resume previous diet.                       - Continue present medications.                       - Await pathology results.                       - Repeat colonoscopy in 5-10 years for surveillance                        based on pathology results.                       - Return to GI office in 2 weeks. Procedure Code(s):    --- Professional ---                       [phone], Colonoscopy, flexible; with biopsy, single or                        multiple Diagnosis Code(s):    --- Professional ---  D12.4, Benign neoplasm of descending colon                       R19.7, Diarrhea, unspecified CPT copyright 2016 American Medical Association. All rights reserved. The codes documented in this report are preliminary and upon coder review may  be revised to meet current compliance requirements. Jonathon Bellows, MD Jonathon Bellows MD, MD 08/09/2016 12:36:18 PM This report has been signed electronically. Number of Addenda: 0 Note Initiated On: 08/09/2016 11:11 AM Scope Withdrawal Time: 0 hours 13 minutes 52 seconds   Total Procedure Duration: 0 hours 22 minutes 24 seconds       [facility]

## 2016-08-09 NOTE — H&P (Signed)
Jonathon Bellows MD [address], [place] [employer] [place] [employer], [place] [address] Phone: [phone] Fax : [phone]  Primary Care Physician:  Cletis Athens, MD Primary Gastroenterologist:  Dr. Jonathon Bellows   Pre-Procedure History & Physical: HPI:  Latoya [name] is a 46 y.o. female is here for an colonoscopy.   Past Medical History:  Diagnosis Date  . Anxiety   . Condyloma acuminata   . Depression   . Hematuria   . PE (pulmonary embolism)     Past Surgical History:  Procedure Laterality Date  . ABDOMINAL HYSTERECTOMY    . ABLATION ON ENDOMETRIOSIS  2007  . RIGHT OOPHORECTOMY    . SALPINGECTOMY Left   . TUBAL LIGATION  1990    Prior to Admission medications   Medication Sig Start Date End Date Taking? Authorizing Provider  lisdexamfetamine (VYVANSE) 50 MG capsule Take 1 capsule (50 mg total) by mouth daily. 12/23/15  Yes Shambley, Melody N, CNM  sertraline (ZOLOFT) 50 MG tablet  06/21/16  Yes [provider]  traZODone (DESYREL) 50 MG tablet  06/21/16  Yes [provider]  Venlafaxine HCl 75 MG TB24  07/15/16  Yes [provider]  cyanocobalamin (,VITAMIN B-12,) 1000 MCG/ML injection Inject 1 mL (1,000 mcg total) into the muscle once. Patient not taking: Reported on 08/09/2016 05/26/15   Shambley, Melody N, CNM  Methen-Hyosc-Meth Blue-Na Phos (UROGESIC-BLUE) 81.6 MG TABS Take 1 tablet (81.6 mg total) by mouth 4 (four) times daily as needed. Patient not taking: Reported on 05/21/2015 10/17/14   Shambley, Melody N, CNM  nitrofurantoin, macrocrystal-monohydrate, (MACROBID) 100 MG capsule Take 1 capsule (100 mg total) by mouth 2 (two) times daily. Patient not taking: Reported on 05/21/2015 10/17/14   Joylene Igo, CNM  phentermine (ADIPEX-P) 37.5 MG tablet Take 1 tablet (37.5 mg total) by mouth daily before breakfast. 05/26/15   Gayla Medicus, Melody N, CNM  venlafaxine XR (EFFEXOR-XR) 150 MG 24 hr capsule  06/15/16   [provider]  warfarin (COUMADIN) 10 MG  tablet TAKE ONE TABLET BY MOUTH ONCE DAILY 04/22/15   Shambley, Melody N, CNM  warfarin (COUMADIN) 10 MG tablet TAKE ONE TABLET BY MOUTH ONCE DAILY Patient not taking: Reported on 08/09/2016 01/06/16   Lorelle Gibbs N, CNM  warfarin (COUMADIN) 5 MG tablet  06/15/16   [provider]    Allergies as of 07/21/2016 - Review Complete 07/21/2015  Allergen Reaction Noted  . Biaxin [clarithromycin] Nausea Only 02/04/2015    History reviewed. No pertinent family history.  Social History   Social History  . Marital status: Married    Spouse name: N/A  . Number of children: N/A  . Years of education: N/A   Occupational History  . Not on file.   Social History Main Topics  . Smoking status: Current Every Day Smoker  . Smokeless tobacco: Never Used  . Alcohol use Yes  . Drug use: No  . Sexual activity: Yes    Birth control/ protection: Surgical   Other Topics Concern  . Not on file   Social History Narrative  . No narrative on file    Review of Systems: See HPI, otherwise negative ROS  Physical Exam: BP 122/77   Pulse 85   Temp 98.6 F (37 C) (Tympanic)   Resp 16   Ht 5\' 9"  (1.753 m)   Wt 172 lb (78 kg)   SpO2 97%   BMI 25.40 kg/m  General:   Alert,  pleasant and cooperative in NAD Head:  Normocephalic  and atraumatic. Neck:  Supple; no masses or thyromegaly. Lungs:  Clear throughout to auscultation.    Heart:  Regular rate and rhythm. Abdomen:  Soft, nontender and nondistended. Normal bowel sounds, without guarding, and without rebound.   Neurologic:  Alert and  oriented x4;  grossly normal neurologically.  Impression/Plan: Latoya [name] is here for an colonoscopy to be performed for diarrhea and rectal bleeding .   Risks, benefits, limitations, and alternatives regarding  colonoscopy have been reviewed with the patient.  Questions have been answered.  All parties agreeable.   Jonathon Bellows, MD  08/09/2016, 12:02 PM

## 2016-08-09 NOTE — Anesthesia Post-op Follow-up Note (Cosign Needed)
Anesthesia QCDR form completed.        

## 2016-08-09 NOTE — Anesthesia Postprocedure Evaluation (Signed)
Anesthesia Post Note  Patient: Latoya [name]  Procedure(s) Performed: Procedure(s) (LRB): COLONOSCOPY WITH PROPOFOL (N/A)  Patient location during evaluation: Endoscopy Anesthesia Type: General Level of consciousness: awake and alert Pain management: pain level controlled Vital Signs Assessment: post-procedure vital signs reviewed and stable Respiratory status: spontaneous breathing, nonlabored ventilation, respiratory function stable and patient connected to nasal cannula oxygen Cardiovascular status: blood pressure returned to baseline and stable Postop Assessment: no signs of nausea or vomiting Anesthetic complications: no     Last Vitals:  Vitals:   08/09/16 1237 08/09/16 1238  BP:    Pulse:    Resp:    Temp: 36.2 C 36.2 C    Last Pain:  Vitals:   08/09/16 1238  TempSrc: Tympanic                 Omari Koslosky S

## 2016-08-09 NOTE — Anesthesia Preprocedure Evaluation (Signed)
Anesthesia Evaluation  Patient identified by MRN, date of birth, ID band Patient awake    Reviewed: Allergy & Precautions, NPO status , Patient's Chart, lab work & pertinent test results, reviewed documented beta blocker date and time   Airway Mallampati: II  TM Distance: >3 FB     Dental  (+) Chipped   Pulmonary Current Smoker,           Cardiovascular      Neuro/Psych Anxiety Depression    GI/Hepatic   Endo/Other    Renal/GU      Musculoskeletal   Abdominal   Peds  Hematology   Anesthesia Other Findings Hx of PE. On coumadin.  Reproductive/Obstetrics                             Anesthesia Physical Anesthesia Plan  ASA: III  Anesthesia Plan: General   Post-op Pain Management:    Induction: Intravenous  Airway Management Planned:   Additional Equipment:   Intra-op Plan:   Post-operative Plan:   Informed Consent: I have reviewed the patients History and Physical, chart, labs and discussed the procedure including the risks, benefits and alternatives for the proposed anesthesia with the patient or authorized representative who has indicated his/her understanding and acceptance.     Plan Discussed with: CRNA  Anesthesia Plan Comments:         Anesthesia Quick Evaluation

## 2016-08-10 ENCOUNTER — Encounter: Payer: Self-pay | Admitting: Gastroenterology

## 2016-08-10 LAB — SURGICAL PATHOLOGY

## 2016-08-15 ENCOUNTER — Encounter: Payer: Self-pay | Admitting: Gastroenterology

## 2016-08-23 ENCOUNTER — Other Ambulatory Visit
Admission: RE | Admit: 2016-08-23 | Discharge: 2016-08-23 | Disposition: A | Discharge status: Home / Self Care | Payer: BLUE CROSS/BLUE SHIELD | Source: Ambulatory Visit | Attending: Gastroenterology | Admitting: Gastroenterology

## 2016-08-23 ENCOUNTER — Ambulatory Visit (INDEPENDENT_AMBULATORY_CARE_PROVIDER_SITE_OTHER): Payer: BLUE CROSS/BLUE SHIELD | Admitting: Gastroenterology

## 2016-08-23 ENCOUNTER — Encounter: Payer: Self-pay | Admitting: Gastroenterology

## 2016-08-23 VITALS — BP 123/86 | HR 80 | Temp 98.2°F | Ht 69.0 in | Wt 178.4 lb

## 2016-08-23 DIAGNOSIS — R1013 Epigastric pain: Secondary | ICD-10-CM | POA: Diagnosis not present

## 2016-08-23 DIAGNOSIS — R197 Diarrhea, unspecified: Secondary | ICD-10-CM | POA: Diagnosis not present

## 2016-08-23 LAB — C-REACTIVE PROTEIN: CRP: <0.8 mg/dL (ref <1.0)

## 2016-08-23 NOTE — Progress Notes (Signed)
Primary Care Physician: Cletis Athens, MD  Primary Gastroenterologist:  Dr. Jonathon Bellows   No chief complaint on file.   HPI: KONSTANTINA NACHREINER is a 46 y.o. female  She is here today to follow up for her recent colonoscopy that I performed on 08/09/16 which revealed a small tubular adenoma. At that time of her procedure she mentioned to me that she had some diarrhea and I took biopsies of her terminal ileum as well as random colon which were normal .   Today she says she is here to discuss further her issues with  "upset stomach " , diarrhea ,    Diarrhea :  Onset: months , worse over the few months    Number of bowel movements a day : 6-7 a day    Color : all different, no blood  Consistency:  Watery and sometimes thicker  Present status: ongoing    Shape of stool:  No    Weight loss:  No weight loss  Prior colonoscopy:  08/2016   Artificial sugars/sodas/chewing gum:  Consumes 2-3 glasses a night and more during weekends - 1 bottle on Friday, Saturday and one on Sunday    Bloating:  Feels getting fat   Gas:  Yes  Antibiotic use: no   Abdominal pain: Onset: on and off for months  Site :feels like an upset stomach in the lower part of the abdomen  Radiation: no  Nature of pain: crampy Aggravating factors:none  Relieving factors :nothing  Weight loss: no NSAID use: none  PPI use :none  Gall bladder surgery: intact  Relief with bowel movements: sometimes   Stress makes it worse.  Current Outpatient Prescriptions  Medication Sig Dispense Refill  . cyanocobalamin (,VITAMIN B-12,) 1000 MCG/ML injection Inject 1 mL (1,000 mcg total) into the muscle once. (Patient not taking: Reported on 08/09/2016) 3 mL 1  . lisdexamfetamine (VYVANSE) 50 MG capsule Take 1 capsule (50 mg total) by mouth daily. 30 capsule 0  . Methen-Hyosc-Meth Blue-Na Phos (UROGESIC-BLUE) 81.6 MG TABS Take 1 tablet (81.6 mg total) by mouth 4 (four) times daily as needed. (Patient not taking: Reported on 05/21/2015)  30 tablet 1  . nitrofurantoin, macrocrystal-monohydrate, (MACROBID) 100 MG capsule Take 1 capsule (100 mg total) by mouth 2 (two) times daily. (Patient not taking: Reported on 05/21/2015) 14 capsule 1  . phentermine (ADIPEX-P) 37.5 MG tablet Take 1 tablet (37.5 mg total) by mouth daily before breakfast. 30 tablet 2  . sertraline (ZOLOFT) 50 MG tablet     . traZODone (DESYREL) 50 MG tablet     . Venlafaxine HCl 75 MG TB24     . venlafaxine XR (EFFEXOR-XR) 150 MG 24 hr capsule     . warfarin (COUMADIN) 10 MG tablet TAKE ONE TABLET BY MOUTH ONCE DAILY 30 tablet 0  . warfarin (COUMADIN) 10 MG tablet TAKE ONE TABLET BY MOUTH ONCE DAILY (Patient not taking: Reported on 08/09/2016) 30 tablet 410  . warfarin (COUMADIN) 5 MG tablet      No current facility-administered medications for this visit.     Allergies as of 08/23/2016 - Review Complete 08/09/2016  Allergen Reaction Noted  . Biaxin [clarithromycin] Nausea Only 02/04/2015    ROS:  General: Negative for anorexia, weight loss, fever, chills, fatigue, weakness. ENT: Negative for hoarseness, difficulty swallowing , nasal congestion. CV: Negative for chest pain, angina, palpitations, dyspnea on exertion, peripheral edema.  Respiratory: Negative for dyspnea at rest, dyspnea on exertion, cough, sputum, wheezing.  GI: See  history of present illness. GU:  Negative for dysuria, hematuria, urinary incontinence, urinary frequency, nocturnal urination.  Endo: Negative for unusual weight change.    Physical Examination:   There were no vitals taken for this visit.  General: Well-nourished, well-developed in no acute distress.  Eyes: No icterus. Conjunctivae pink. Mouth: Oropharyngeal mucosa moist and pink , no lesions erythema or exudate. Lungs: Clear to auscultation bilaterally. Non-labored. Heart: Regular rate and rhythm, no murmurs rubs or gallops.  Abdomen: Bowel sounds are normal, nontender, nondistended, no hepatosplenomegaly or masses, no  abdominal bruits or hernia , no rebound or guarding.   Extremities: No lower extremity edema. No clubbing or deformities. Neuro: Alert and oriented x 3.  Grossly intact. Skin: Warm and dry, no jaundice.   Psych: Alert and cooperative, normal mood and affect.   Imaging Studies: No results found.  Assessment and Plan:   JACKLYN BRANAN is a 46 y.o. y/o female here today to follow up for diarrhea , abdominal discomfort. Based on her history it is very likely that the large qty of alcohol is playing a rule with her symptoms. Suggested to stop all alcohol , vaping and will get below tests as outlined.   Plan  1. Stool tests to r/o infection and inflammation  2. Stop smoking and consuming excess alcohol  3. Check celiac panel  4. If all studies are negative will need EGD+CT abdomen   Dr Jonathon Bellows  MD Follow up in 4 weeks.

## 2016-08-25 ENCOUNTER — Encounter: Payer: Self-pay | Admitting: Gastroenterology

## 2016-08-25 ENCOUNTER — Telehealth: Payer: Self-pay

## 2016-08-25 LAB — CELIAC DISEASE PANEL
Endomysial Ab, IgA: NEGATIVE
IgA: 186 mg/dL (ref 87–352)
Tissue Transglutaminase Ab, IgA: <2 U/mL (ref 0–3)

## 2016-08-25 NOTE — Telephone Encounter (Signed)
-----   Message from Jonathon Bellows, MD sent at 08/25/2016  9:55 AM EDT ----- Celiac serology negative

## 2016-08-25 NOTE — Telephone Encounter (Signed)
Advised pt of results per Dr. Vicente Males.   Celiac serology negative

## 2016-08-29 ENCOUNTER — Other Ambulatory Visit
Admission: RE | Admit: 2016-08-29 | Discharge: 2016-08-29 | Disposition: A | Discharge status: Home / Self Care | Payer: BLUE CROSS/BLUE SHIELD | Source: Ambulatory Visit | Attending: Gastroenterology | Admitting: Gastroenterology

## 2016-08-29 DIAGNOSIS — R197 Diarrhea, unspecified: Secondary | ICD-10-CM | POA: Insufficient documentation

## 2016-08-29 LAB — GASTROINTESTINAL PANEL BY PCR, STOOL (REPLACES STOOL CULTURE)

## 2016-08-29 LAB — C DIFFICILE QUICK SCREEN W PCR REFLEX
C Diff antigen: NEGATIVE
C Diff interpretation: NOT DETECTED
C Diff toxin: NEGATIVE

## 2016-08-31 ENCOUNTER — Telehealth: Payer: Self-pay

## 2016-08-31 LAB — H. PYLORI ANTIGEN, STOOL: H. Pylori Stool Ag, Eia: NEGATIVE

## 2016-08-31 NOTE — Telephone Encounter (Signed)
-----   Message from Jonathon Bellows, MD sent at 08/30/2016  8:31 AM EDT ----- Stool tests negative for infection

## 2016-08-31 NOTE — Telephone Encounter (Signed)
LVM for patient callback to advise results per Dr. Vicente Males.   Stool tests negative for infection

## 2016-09-01 ENCOUNTER — Telehealth: Payer: Self-pay | Admitting: Gastroenterology

## 2016-09-01 NOTE — Telephone Encounter (Signed)
Patient is returning you call regarding results

## 2016-09-20 ENCOUNTER — Ambulatory Visit: Payer: BLUE CROSS/BLUE SHIELD | Admitting: Gastroenterology

## 2016-10-04 DIAGNOSIS — M5440 Lumbago with sciatica, unspecified side: Secondary | ICD-10-CM | POA: Diagnosis not present

## 2016-10-04 DIAGNOSIS — D684 Acquired coagulation factor deficiency: Secondary | ICD-10-CM | POA: Diagnosis not present

## 2016-10-04 DIAGNOSIS — Z01818 Encounter for other preprocedural examination: Secondary | ICD-10-CM | POA: Diagnosis not present

## 2016-10-04 DIAGNOSIS — I2699 Other pulmonary embolism without acute cor pulmonale: Secondary | ICD-10-CM | POA: Diagnosis not present

## 2016-10-20 DIAGNOSIS — M5440 Lumbago with sciatica, unspecified side: Secondary | ICD-10-CM | POA: Diagnosis not present

## 2016-10-20 DIAGNOSIS — I2699 Other pulmonary embolism without acute cor pulmonale: Secondary | ICD-10-CM | POA: Diagnosis not present

## 2016-10-20 DIAGNOSIS — D684 Acquired coagulation factor deficiency: Secondary | ICD-10-CM | POA: Diagnosis not present

## 2016-10-20 DIAGNOSIS — F988 Other specified behavioral and emotional disorders with onset usually occurring in childhood and adolescence: Secondary | ICD-10-CM | POA: Diagnosis not present

## 2016-11-01 ENCOUNTER — Ambulatory Visit: Payer: BLUE CROSS/BLUE SHIELD | Admitting: Gastroenterology

## 2017-01-16 DIAGNOSIS — M5440 Lumbago with sciatica, unspecified side: Secondary | ICD-10-CM | POA: Diagnosis not present

## 2017-01-16 DIAGNOSIS — D684 Acquired coagulation factor deficiency: Secondary | ICD-10-CM | POA: Diagnosis not present

## 2017-01-16 DIAGNOSIS — F988 Other specified behavioral and emotional disorders with onset usually occurring in childhood and adolescence: Secondary | ICD-10-CM | POA: Diagnosis not present

## 2017-01-16 DIAGNOSIS — J208 Acute bronchitis due to other specified organisms: Secondary | ICD-10-CM | POA: Diagnosis not present

## 2017-05-10 DIAGNOSIS — M5440 Lumbago with sciatica, unspecified side: Secondary | ICD-10-CM | POA: Diagnosis not present

## 2017-05-10 DIAGNOSIS — I2699 Other pulmonary embolism without acute cor pulmonale: Secondary | ICD-10-CM | POA: Diagnosis not present

## 2017-05-10 DIAGNOSIS — F988 Other specified behavioral and emotional disorders with onset usually occurring in childhood and adolescence: Secondary | ICD-10-CM | POA: Diagnosis not present

## 2017-05-10 DIAGNOSIS — D684 Acquired coagulation factor deficiency: Secondary | ICD-10-CM | POA: Diagnosis not present

## 2017-07-06 DIAGNOSIS — D684 Acquired coagulation factor deficiency: Secondary | ICD-10-CM | POA: Diagnosis not present

## 2017-07-06 DIAGNOSIS — F988 Other specified behavioral and emotional disorders with onset usually occurring in childhood and adolescence: Secondary | ICD-10-CM | POA: Diagnosis not present

## 2017-07-06 DIAGNOSIS — M5440 Lumbago with sciatica, unspecified side: Secondary | ICD-10-CM | POA: Diagnosis not present

## 2017-07-06 DIAGNOSIS — I2699 Other pulmonary embolism without acute cor pulmonale: Secondary | ICD-10-CM | POA: Diagnosis not present

## 2017-07-17 ENCOUNTER — Other Ambulatory Visit: Payer: Self-pay

## 2017-07-17 ENCOUNTER — Emergency Department
Admission: EM | Admit: 2017-07-17 | Discharge: 2017-07-17 | Disposition: A | Discharge status: Home / Self Care | Payer: BLUE CROSS/BLUE SHIELD | Attending: Emergency Medicine | Admitting: Emergency Medicine

## 2017-07-17 ENCOUNTER — Encounter: Payer: Self-pay | Admitting: Emergency Medicine

## 2017-07-17 DIAGNOSIS — F17210 Nicotine dependence, cigarettes, uncomplicated: Secondary | ICD-10-CM | POA: Insufficient documentation

## 2017-07-17 DIAGNOSIS — R197 Diarrhea, unspecified: Secondary | ICD-10-CM | POA: Diagnosis not present

## 2017-07-17 DIAGNOSIS — Z7901 Long term (current) use of anticoagulants: Secondary | ICD-10-CM | POA: Insufficient documentation

## 2017-07-17 DIAGNOSIS — B9789 Other viral agents as the cause of diseases classified elsewhere: Secondary | ICD-10-CM | POA: Diagnosis not present

## 2017-07-17 DIAGNOSIS — A0811 Acute gastroenteropathy due to Norwalk agent: Secondary | ICD-10-CM | POA: Insufficient documentation

## 2017-07-17 DIAGNOSIS — R112 Nausea with vomiting, unspecified: Secondary | ICD-10-CM | POA: Diagnosis not present

## 2017-07-17 DIAGNOSIS — Z79899 Other long term (current) drug therapy: Secondary | ICD-10-CM | POA: Insufficient documentation

## 2017-07-17 LAB — URINALYSIS, COMPLETE (UACMP) WITH MICROSCOPIC
Bacteria, UA: NONE SEEN
Bilirubin Urine: NEGATIVE
Glucose, UA: NEGATIVE mg/dL
Ketones, ur: NEGATIVE mg/dL
Leukocytes, UA: NEGATIVE
Nitrite: NEGATIVE
Protein, ur: NEGATIVE mg/dL
Specific Gravity, Urine: 1.013 (ref 1.005–1.030)
pH: 5 (ref 5.0–8.0)

## 2017-07-17 LAB — COMPREHENSIVE METABOLIC PANEL
ALT: 28 U/L (ref 14–54)
AST: 23 U/L (ref 15–41)
Albumin: 4.2 g/dL (ref 3.5–5.0)
Alkaline Phosphatase: 50 U/L (ref 38–126)
Anion gap: 6 (ref 5–15)
BUN: 6 mg/dL (ref 6–20)
CO2: 25 mmol/L (ref 22–32)
Calcium: 8.6 mg/dL — ABNORMAL LOW (ref 8.9–10.3)
Chloride: 106 mmol/L (ref 101–111)
Creatinine, Ser: 0.56 mg/dL (ref 0.44–1.00)
GFR calc Af Amer: >60 mL/min (ref >60)
GFR calc non Af Amer: >60 mL/min (ref >60)
Glucose, Bld: 77 mg/dL (ref 65–99)
Potassium: 3.5 mmol/L (ref 3.5–5.1)
Sodium: 137 mmol/L (ref 135–145)
Total Bilirubin: 0.4 mg/dL (ref 0.3–1.2)
Total Protein: 6.7 g/dL (ref 6.5–8.1)

## 2017-07-17 LAB — CBC
HCT: 41.5 % (ref 35.0–47.0)
Hemoglobin: 14.5 g/dL (ref 12.0–16.0)
MCH: 35 pg — ABNORMAL HIGH (ref 26.0–34.0)
MCHC: 34.8 g/dL (ref 32.0–36.0)
MCV: 100.6 fL — ABNORMAL HIGH (ref 80.0–100.0)
Platelets: 296 10*3/uL (ref 150–440)
RBC: 4.13 MIL/uL (ref 3.80–5.20)
RDW: 13.1 % (ref 11.5–14.5)
WBC: 5.4 10*3/uL (ref 3.6–11.0)

## 2017-07-17 LAB — LIPASE, BLOOD: Lipase: 35 U/L (ref 11–51)

## 2017-07-17 MED ORDER — PROMETHAZINE HCL 25 MG/ML IJ SOLN
25.0000 mg | Freq: Once | INTRAMUSCULAR | Status: AC
  Administered 2017-07-17: 25 mg via INTRAVENOUS
  Filled 2017-07-17: qty 1

## 2017-07-17 MED ORDER — SODIUM CHLORIDE 0.9 % IV BOLUS
1000.0000 mL | Freq: Once | INTRAVENOUS | Status: AC
  Administered 2017-07-17: 1000 mL via INTRAVENOUS

## 2017-07-17 MED ORDER — OMEPRAZOLE 10 MG PO CPDR: 10.0000 mg | DELAYED_RELEASE_CAPSULE | Freq: Every day | ORAL | 0 refills | Status: DC

## 2017-07-17 MED ORDER — LOPERAMIDE HCL 2 MG PO TABS: 2.0000 mg | ORAL_TABLET | Freq: Four times a day (QID) | ORAL | 0 refills | Status: DC | PRN

## 2017-07-17 MED ORDER — ONDANSETRON 4 MG PO TBDP: 4.0000 mg | ORAL_TABLET | Freq: Three times a day (TID) | ORAL | 0 refills | Status: DC | PRN

## 2017-07-17 NOTE — ED Provider Notes (Signed)
[facility] Emergency Department Provider Note  ____________________________________________  Time seen: Approximately 3:59 PM  I have reviewed the triage vital signs and the nursing notes.   HISTORY  Chief Complaint Abdominal Pain and Emesis    HPI Latoya [name] is a 47 y.o. female who presents the emergency department with nausea, vomiting, diarrhea times 6 days.  Patient reports that she went out to eat with some friends, did not eat any foods out of the ordinary 6 days ago.  Patient reports that she returned home and within an hour became violently sick.  First, patient thought she had food poisoning, however the emesis continued for another 4 days, and the diarrhea has been persistent through the entire course.  Patient denies any hematemesis or hematochezia.  No history of Crohn's, IBS, GERD, diverticulitis, celiac's.  Patient is tried multiple over-the-counter remedies, eating the brat diet, foregoing eating with no success.  Patient reports that she has had limited intake over the past several days due to symptoms.  Patient denies any headache, vision changes, nasal congestion, sore throat, cough, dysuria, polyuria, hematuria.  Patient reports diffuse abdominal pain with no specific tenderness.  Reports possible, subjective low-grade fever.  No other complaints.  Past Medical History:  Diagnosis Date  . Anxiety   . Condyloma acuminata   . Depression   . Hematuria   . PE (pulmonary embolism)     Patient Active Problem List   Diagnosis Date Noted  . Anxiety 05/26/2015  . Clinical depression 05/26/2015  . Pulmonary embolism ([place]) 05/26/2015    Past Surgical History:  Procedure Laterality Date  . ABDOMINAL HYSTERECTOMY    . ABLATION ON ENDOMETRIOSIS  2007  . COLONOSCOPY WITH PROPOFOL N/A 08/09/2016   Procedure: COLONOSCOPY WITH PROPOFOL;  Surgeon: Jonathon Bellows, MD;  [facility]: [facility] ENDOSCOPY;  Service: Endoscopy;  Laterality: N/A;  . RIGHT  OOPHORECTOMY    . SALPINGECTOMY Left   . TUBAL LIGATION  1990    Prior to Admission medications   Medication Sig Start Date End Date Taking? Authorizing Provider  lisdexamfetamine (VYVANSE) 50 MG capsule Take 1 capsule (50 mg total) by mouth daily. 12/23/15   Shambley, Melody N, CNM  loperamide (IMODIUM A-D) 2 MG tablet Take 1 tablet (2 mg total) by mouth 4 (four) times daily as needed for diarrhea or loose stools. 07/17/17   Cuthriell, Charline Bills, PA-C  omeprazole (PRILOSEC) 10 MG capsule Take 1 capsule (10 mg total) by mouth daily. 07/17/17   Cuthriell, Charline Bills, PA-C  ondansetron (ZOFRAN-ODT) 4 MG disintegrating tablet Take 1 tablet (4 mg total) by mouth every 8 (eight) hours as needed for nausea or vomiting. 07/17/17   Cuthriell, Charline Bills, PA-C  sertraline (ZOLOFT) 50 MG tablet  06/21/16   [provider]  traZODone (DESYREL) 50 MG tablet  06/21/16   [provider]  Venlafaxine HCl 75 MG TB24  07/15/16   [provider]  warfarin (COUMADIN) 10 MG tablet TAKE ONE TABLET BY MOUTH ONCE DAILY 04/22/15   Shambley, Melody N, CNM  warfarin (COUMADIN) 10 MG tablet TAKE ONE TABLET BY MOUTH ONCE DAILY 01/06/16   Shambley, Melody N, CNM  warfarin (COUMADIN) 5 MG tablet  06/15/16   [provider]    Allergies Biaxin [clarithromycin]  Family History  Problem Relation Age of Onset  . Pulmonary embolism Mother   . Lymphoma Father   . Multiple sclerosis Sister   . Lymphoma Brother     Social History Social History  Tobacco Use  . Smoking status: Current Every Day Smoker    Packs/day: 0.41    Types: Cigarettes, E-cigarettes  . Smokeless tobacco: Never Used  Substance Use Topics  . Alcohol use: Yes    Alcohol/week: 12.6 oz    Types: 21 Glasses of wine per week  . Drug use: No     Review of Systems  Constitutional: No fever/chills Eyes: No visual changes. No discharge ENT: No upper respiratory complaints. Cardiovascular: no chest pain. Respiratory:  no cough. No SOB. Gastrointestinal: Diffuse abdominal pain.  Positive for nausea, vomiting, diarrhea..  No constipation. Genitourinary: Negative for dysuria. No hematuria Musculoskeletal: Negative for musculoskeletal pain. Skin: Negative for rash, abrasions, lacerations, ecchymosis. Neurological: Negative for headaches, focal weakness or numbness. 10-point ROS otherwise negative.  ____________________________________________   PHYSICAL EXAM:  VITAL SIGNS: ED Triage Vitals  Enc Vitals Group     BP 07/17/17 1206 138/83     Pulse Rate 07/17/17 1206 90     Resp 07/17/17 1206 18     Temp 07/17/17 1206 98.6 F (37 C)     Temp Source 07/17/17 1206 Oral     SpO2 07/17/17 1206 96 %     Weight 07/17/17 1207 168 lb (76.2 kg)     Height 07/17/17 1207 5\' 9"  (1.753 m)     Head Circumference --      Peak Flow --      Pain Score 07/17/17 1210 5     Pain Loc --      Pain Edu? --      Excl. in [place]? --      Constitutional: Alert and oriented. Well appearing and in no acute distress. Eyes: Conjunctivae are normal. PERRL. EOMI. Head: Atraumatic. ENT:      Ears: EACs and TMs unremarkable bilaterally.      Nose: No congestion/rhinnorhea.      Mouth/Throat: Mucous membranes are moist.  Neck: No stridor.   Hematological/Lymphatic/Immunilogical: No cervical lymphadenopathy. Cardiovascular: Normal rate, regular rhythm. Normal S1 and S2.  Good peripheral circulation. Respiratory: Normal respiratory effort without tachypnea or retractions. Lungs CTAB. Good air entry to the bases with no decreased or absent breath sounds. Gastrointestinal: Bowel sounds 4 quadrants. Soft to palpation all quadrants.  Mild, diffuse tenderness with no specific point tenderness.. No guarding or rigidity. No palpable masses. No distention. No CVA tenderness. Musculoskeletal: Full range of motion to all extremities. No gross deformities appreciated. Neurologic:  Normal speech and language. No gross focal neurologic  deficits are appreciated.  Skin:  Skin is warm, dry and intact. No rash noted. Psychiatric: Mood and affect are normal. Speech and behavior are normal. Patient exhibits appropriate insight and judgement.   ____________________________________________   LABS (all labs ordered are listed, but only abnormal results are displayed)  Labs Reviewed  COMPREHENSIVE METABOLIC PANEL - Abnormal; Notable for the following components:      Result Value   Calcium 8.6 (*)    All other components within normal limits  CBC - Abnormal; Notable for the following components:   MCV 100.6 (*)    MCH 35.0 (*)    All other components within normal limits  URINALYSIS, COMPLETE (UACMP) WITH MICROSCOPIC - Abnormal; Notable for the following components:   Color, Urine YELLOW (*)    APPearance CLEAR (*)    Hgb urine dipstick SMALL (*)    Squamous Epithelial / LPF 0-5 (*)    All other components within normal limits  LIPASE, BLOOD   ____________________________________________  EKG  ____________________________________________  RADIOLOGY   No results found.  ____________________________________________    PROCEDURES  Procedure(s) performed:    Procedures    Medications  sodium chloride 0.9 % bolus 1,000 mL (1,000 mLs Intravenous New Bag/Given 07/17/17 1627)  promethazine (PHENERGAN) injection 25 mg (25 mg Intravenous Given 07/17/17 1627)     ____________________________________________   INITIAL IMPRESSION / ASSESSMENT AND PLAN / ED COURSE  Pertinent labs & imaging results that were available during my care of the patient were reviewed by me and considered in my medical decision making (see chart for details).  Review of the [place] CSRS was performed in accordance of the [place] prior to dispensing any controlled drugs.     Patient's diagnosis is consistent with Alinda Sierras virus.  Patient has had 6 days of nausea, vomiting, subjective fever.  Differential included Crohn's, IBS,  diverticulitis, Alinda Sierras virus.  Overall, patient's exam is reassuring.  Labs were reviewed.  Patient does have hyaline casts.  Patient denies any other recent illnesses, history of kidney failure.  Due to the significant nausea, vomiting, diarrhea I suspect that patient is clinically dehydrated explaining hyaline cast.  I have reviewed patient's labs with her, and suggested rehydration with IV fluids in the emergency department.  After symptoms have resolved, she is to follow-up with primary care to ensure that urine does not continue to show hyaline cast.  If it does, she will follow-up with urology.. Patient will be discharged home with prescriptions for Zofran, omeprazole, Imodium. Patient is to follow up with [facility] care as needed or otherwise directed. Patient is given ED precautions to return to the ED for any worsening or new symptoms.     ____________________________________________  FINAL CLINICAL IMPRESSION(S) / ED DIAGNOSES  Final diagnoses:  Norovirus      NEW MEDICATIONS STARTED DURING THIS VISIT:  ED Discharge Orders        Ordered    ondansetron (ZOFRAN-ODT) 4 MG disintegrating tablet  Every 8 hours PRN     07/17/17 1638    omeprazole (PRILOSEC) 10 MG capsule  Daily     07/17/17 1638    loperamide (IMODIUM A-D) 2 MG tablet  4 times daily PRN     07/17/17 1638          This chart was dictated using voice recognition software/Dragon. Despite best efforts to proofread, errors can occur which can change the meaning. Any change was purely unintentional.    Darletta Moll, PA-C 07/17/17 1641    Arta Silence, MD 07/17/17 2356

## 2017-07-17 NOTE — ED Triage Notes (Signed)
Arrives with C/O emesis and diarrhea x 1 week.  States has not had any emesis since Friday, but diarrhea persists.

## 2017-07-17 NOTE — ED Notes (Signed)
See triage note.  States she developed some abd discomfort with n/v/d about 1 1/2 weeks ago  Cont's with diarrhea

## 2017-10-19 DIAGNOSIS — W57XXXA Bitten or stung by nonvenomous insect and other nonvenomous arthropods, initial encounter: Secondary | ICD-10-CM | POA: Diagnosis not present

## 2017-10-19 DIAGNOSIS — D684 Acquired coagulation factor deficiency: Secondary | ICD-10-CM | POA: Diagnosis not present

## 2017-10-19 DIAGNOSIS — F988 Other specified behavioral and emotional disorders with onset usually occurring in childhood and adolescence: Secondary | ICD-10-CM | POA: Diagnosis not present

## 2017-10-19 DIAGNOSIS — L109 Pemphigus, unspecified: Secondary | ICD-10-CM | POA: Diagnosis not present

## 2018-04-05 ENCOUNTER — Emergency Department: Payer: 59

## 2018-04-05 ENCOUNTER — Encounter: Payer: Self-pay | Admitting: Emergency Medicine

## 2018-04-05 ENCOUNTER — Other Ambulatory Visit: Payer: Self-pay

## 2018-04-05 DIAGNOSIS — F419 Anxiety disorder, unspecified: Secondary | ICD-10-CM | POA: Diagnosis not present

## 2018-04-05 DIAGNOSIS — Z7901 Long term (current) use of anticoagulants: Secondary | ICD-10-CM | POA: Insufficient documentation

## 2018-04-05 DIAGNOSIS — Z79899 Other long term (current) drug therapy: Secondary | ICD-10-CM | POA: Insufficient documentation

## 2018-04-05 DIAGNOSIS — R002 Palpitations: Secondary | ICD-10-CM | POA: Diagnosis not present

## 2018-04-05 DIAGNOSIS — F17210 Nicotine dependence, cigarettes, uncomplicated: Secondary | ICD-10-CM | POA: Insufficient documentation

## 2018-04-05 DIAGNOSIS — I10 Essential (primary) hypertension: Secondary | ICD-10-CM | POA: Diagnosis not present

## 2018-04-05 DIAGNOSIS — R0600 Dyspnea, unspecified: Secondary | ICD-10-CM | POA: Diagnosis not present

## 2018-04-05 LAB — COMPREHENSIVE METABOLIC PANEL
ALT: 31 U/L (ref 0–44)
AST: 28 U/L (ref 15–41)
Albumin: 4.5 g/dL (ref 3.5–5.0)
Alkaline Phosphatase: 70 U/L (ref 38–126)
Anion gap: 9 (ref 5–15)
BUN: 11 mg/dL (ref 6–20)
CO2: 23 mmol/L (ref 22–32)
Calcium: 9.1 mg/dL (ref 8.9–10.3)
Chloride: 105 mmol/L (ref 98–111)
Creatinine, Ser: 0.5 mg/dL (ref 0.44–1.00)
GFR calc Af Amer: >60 mL/min (ref >60)
GFR calc non Af Amer: >60 mL/min (ref >60)
Glucose, Bld: 120 mg/dL — ABNORMAL HIGH (ref 70–99)
Potassium: 3.5 mmol/L (ref 3.5–5.1)
Sodium: 137 mmol/L (ref 135–145)
Total Bilirubin: 0.3 mg/dL (ref 0.3–1.2)
Total Protein: 7.5 g/dL (ref 6.5–8.1)

## 2018-04-05 LAB — CBC WITH DIFFERENTIAL/PLATELET
Abs Immature Granulocytes: 0.02 10*3/uL (ref 0.00–0.07)
Basophils Absolute: 0.1 10*3/uL (ref 0.0–0.1)
Basophils Relative: 1 %
Eosinophils Absolute: 0.1 10*3/uL (ref 0.0–0.5)
Eosinophils Relative: 1 %
HCT: 41.3 % (ref 36.0–46.0)
Hemoglobin: 14.4 g/dL (ref 12.0–15.0)
Immature Granulocytes: 0 %
Lymphocytes Relative: 29 %
Lymphs Abs: 1.8 10*3/uL (ref 0.7–4.0)
MCH: 34.1 pg — ABNORMAL HIGH (ref 26.0–34.0)
MCHC: 34.9 g/dL (ref 30.0–36.0)
MCV: 97.9 fL (ref 80.0–100.0)
Monocytes Absolute: 0.5 10*3/uL (ref 0.1–1.0)
Monocytes Relative: 8 %
Neutro Abs: 4 10*3/uL (ref 1.7–7.7)
Neutrophils Relative %: 61 %
Platelets: 348 10*3/uL (ref 150–400)
RBC: 4.22 MIL/uL (ref 3.87–5.11)
RDW: 13 % (ref 11.5–15.5)
WBC: 6.4 10*3/uL (ref 4.0–10.5)
nRBC: 0 % (ref 0.0–0.2)

## 2018-04-05 LAB — TROPONIN I: Troponin I: <0.03 ng/mL (ref <0.03)

## 2018-04-05 NOTE — ED Triage Notes (Signed)
Patient ambulatory to triage with steady gait, without difficulty or distress noted; pt reports 150/105; denies hx of HTN; c/o generalized HA and sensation of heart racing for several days

## 2018-04-06 ENCOUNTER — Emergency Department
Admission: EM | Admit: 2018-04-06 | Discharge: 2018-04-06 | Disposition: A | Discharge status: Home / Self Care | Payer: 59 | Attending: Emergency Medicine | Admitting: Emergency Medicine

## 2018-04-06 DIAGNOSIS — R002 Palpitations: Secondary | ICD-10-CM

## 2018-04-06 DIAGNOSIS — F419 Anxiety disorder, unspecified: Secondary | ICD-10-CM

## 2018-04-06 LAB — TSH: TSH: 4.053 u[IU]/mL (ref 0.350–4.500)

## 2018-04-06 LAB — T4, FREE: Free T4: 0.67 ng/dL — ABNORMAL LOW (ref 0.82–1.77)

## 2018-04-06 MED ORDER — LORAZEPAM 1 MG PO TABS
1.0000 mg | ORAL_TABLET | Freq: Once | ORAL | Status: AC
  Administered 2018-04-06: 1 mg via ORAL
  Filled 2018-04-06: qty 1

## 2018-04-06 MED ORDER — LORAZEPAM 1 MG PO TABS: 1.0000 mg | ORAL_TABLET | Freq: Three times a day (TID) | ORAL | 0 refills | Status: AC | PRN

## 2018-04-06 MED ORDER — SODIUM CHLORIDE 0.9 % IV BOLUS: 1000.0000 mL | Freq: Once | INTRAVENOUS | Status: DC

## 2018-04-06 NOTE — ED Provider Notes (Signed)
[facility] Emergency Department Provider Note  ____________________________________________   First MD Initiated Contact with Patient 04/06/18 0107     (approximate)  I have reviewed the triage vital signs and the nursing notes.   HISTORY  Chief Complaint Hypertension and Palpitations   HPI Latoya [name] is a 48 y.o. female who comes to the emergency department with chest pain, shortness of breath, and generalized malaise that began acutely this evening.  Symptoms began roughly an hour prior to arrival when she got into an argument with her significant other regarding finances.  She feels "palpitations".  She takes rivaroxaban for previous pulmonary embolism.  She has not missed any doses.  She wears a smart watch and noticed earlier when she got into the argument that her heart rate was into the 130s.  It never said "atrial fibrillation" although at 1 point did say it was unable to tell her what rhythm she was in.  The heart rate varied widely from around 100 up to 130.  She has had no leg swelling.  No hemoptysis.  No recent surgery travel or immobilization.  She does have a longstanding history of anxiety.  No change in her weight.  No change in skin or hair.  No throat pain.  Symptoms came on suddenly were severe and are slowly improving with time.     Past Medical History:  Diagnosis Date  . Anxiety   . Condyloma acuminata   . Depression   . Hematuria   . PE (pulmonary embolism)     Patient Active Problem List   Diagnosis Date Noted  . Anxiety 05/26/2015  . Clinical depression 05/26/2015  . Pulmonary embolism ([place]) 05/26/2015    Past Surgical History:  Procedure Laterality Date  . ABDOMINAL HYSTERECTOMY    . ABLATION ON ENDOMETRIOSIS  2007  . COLONOSCOPY WITH PROPOFOL N/A 08/09/2016   Procedure: COLONOSCOPY WITH PROPOFOL;  Surgeon: Jonathon Bellows, MD;  Location: [facility] ENDOSCOPY;  Service: Endoscopy;  Laterality: N/A;  . RIGHT OOPHORECTOMY      . SALPINGECTOMY Left   . TUBAL LIGATION  1990    Prior to Admission medications   Medication Sig Start Date End Date Taking? Authorizing Provider  lisdexamfetamine (VYVANSE) 50 MG capsule Take 1 capsule (50 mg total) by mouth daily. 12/23/15   Shambley, Melody N, CNM  loperamide (IMODIUM A-D) 2 MG tablet Take 1 tablet (2 mg total) by mouth 4 (four) times daily as needed for diarrhea or loose stools. 07/17/17   Cuthriell, Charline Bills, PA-C  LORazepam (ATIVAN) 1 MG tablet Take 1 tablet (1 mg total) by mouth 3 (three) times daily as needed for anxiety. 04/06/18 04/06/19  Darel Hong, MD  omeprazole (PRILOSEC) 10 MG capsule Take 1 capsule (10 mg total) by mouth daily. 07/17/17   Cuthriell, Charline Bills, PA-C  ondansetron (ZOFRAN-ODT) 4 MG disintegrating tablet Take 1 tablet (4 mg total) by mouth every 8 (eight) hours as needed for nausea or vomiting. 07/17/17   Cuthriell, Charline Bills, PA-C  sertraline (ZOLOFT) 50 MG tablet  06/21/16   [provider]  traZODone (DESYREL) 50 MG tablet  06/21/16   [provider]  Venlafaxine HCl 75 MG TB24  07/15/16   [provider]  warfarin (COUMADIN) 10 MG tablet TAKE ONE TABLET BY MOUTH ONCE DAILY 04/22/15   Shambley, Melody N, CNM  warfarin (COUMADIN) 10 MG tablet TAKE ONE TABLET BY MOUTH ONCE DAILY 01/06/16   Shambley, Melody N, CNM  warfarin (COUMADIN) 5  MG tablet  06/15/16   [provider]    Allergies Biaxin [clarithromycin]  Family History  Problem Relation Age of Onset  . Pulmonary embolism Mother   . Lymphoma Father   . Multiple sclerosis Sister   . Lymphoma Brother     Social History Social History   Tobacco Use  . Smoking status: Current Every Day Smoker    Packs/day: 0.41    Types: Cigarettes, E-cigarettes  . Smokeless tobacco: Never Used  Substance Use Topics  . Alcohol use: Yes    Alcohol/week: 21.0 standard drinks    Types: 21 Glasses of wine per week  . Drug use: No    Review of  Systems Constitutional: No fever/chills Eyes: No visual changes. ENT: No sore throat. Cardiovascular: Positive for chest pain. Respiratory: Positive for shortness of breath. Gastrointestinal: No abdominal pain.  Positive for nausea, no vomiting.  No diarrhea.  No constipation. Genitourinary: Negative for dysuria. Musculoskeletal: Negative for back pain. Skin: Negative for rash. Neurological: Negative for headaches, focal weakness or numbness.   ____________________________________________   PHYSICAL EXAM:  VITAL SIGNS: ED Triage Vitals  Enc Vitals Group     BP 04/05/18 2118 (!) 143/99     Pulse Rate 04/05/18 2118 (!) 127     Resp 04/05/18 2118 20     Temp 04/05/18 2118 98.4 F (36.9 C)     Temp Source 04/05/18 2118 Oral     SpO2 04/05/18 2118 96 %     Weight 04/05/18 2109 170 lb (77.1 kg)     Height 04/05/18 2109 5\' 9"  (1.753 m)     Head Circumference --      Peak Flow --      Pain Score 04/05/18 2108 5     Pain Loc --      Pain Edu? --      Excl. in [place]? --     Constitutional: Alert and oriented x4 appears somewhat anxious and tearful although nontoxic in no diaphoresis Eyes: PERRL EOMI. Head: Atraumatic. Nose: No congestion/rhinnorhea. Mouth/Throat: No trismus Neck: No stridor.  Thyroid not palpable Cardiovascular: Tachycardic rate, regular rhythm. Grossly normal heart sounds.  Good peripheral circulation. Respiratory: Increased respiratory effort.  No retractions. Lungs CTAB and moving good air Gastrointestinal: Soft nontender Musculoskeletal: No lower extremity edema legs are equal in size Neurologic:  Normal speech and language. No gross focal neurologic deficits are appreciated. Skin:  Skin is warm, dry and intact. No rash noted. Psychiatric: Mildly anxious appearing    ____________________________________________   DIFFERENTIAL includes but not limited to  Thyrotoxicosis, panic attack, anxiety, pulmonary embolism, aortic dissection, pneumonia,  pneumothorax ____________________________________________   LABS (all labs ordered are listed, but only abnormal results are displayed)  Labs Reviewed  CBC WITH DIFFERENTIAL/PLATELET - Abnormal; Notable for the following components:      Result Value   MCH 34.1 (*)    All other components within normal limits  COMPREHENSIVE METABOLIC PANEL - Abnormal; Notable for the following components:   Glucose, Bld 120 (*)    All other components within normal limits  T4, FREE - Abnormal; Notable for the following components:   Free T4 0.67 (*)    All other components within normal limits  TROPONIN I  TSH    Lab work reviewed by me with no acute disease __________________________________________  EKG  ED ECG REPORT I, Darel Hong, the attending physician, personally viewed and interpreted this ECG.  Date: 04/06/2018 EKG Time:  Rate: 115 Rhythm: normal sinus rhythm  QRS Axis: Leftward axis Intervals: normal ST/T Wave abnormalities: normal Narrative Interpretation: no evidence of acute ischemia  ____________________________________________  RADIOLOGY  Chest x-ray reviewed by me with no acute disease ____________________________________________   PROCEDURES  Procedure(s) performed: no  Procedures  Critical Care performed: no  ____________________________________________   INITIAL IMPRESSION / ASSESSMENT AND PLAN / ED COURSE  Pertinent labs & imaging results that were available during my care of the patient were reviewed by me and considered in my medical decision making (see chart for details).   As part of my medical decision making, I reviewed the following data within the [place] History obtained from family if available, nursing notes, old chart and ekg, as well as notes from prior ED visits.  The patient comes to the emergency department with palpitations immediately after getting into an argument with her significant other.  She arrives  tachycardic and somewhat uncomfortable appearing and after lorazepam and IV fluids her symptoms are dramatically improved.  She reports compliance with her rivaroxaban and has had no blood clots since beginning her medication.  Her EKG is reassuring and her lab work is as well.  I had a lengthy discussion with the patient regarding the diagnostic uncertainty but palpitations, nausea, and shortness of breath after getting into an argument are quite normal.  She feels significant reassurance and I will discharge her home with a short course of Ativan and primary care follow-up.  Strict return precautions have been given.      ____________________________________________   FINAL CLINICAL IMPRESSION(S) / ED DIAGNOSES  Final diagnoses:  Palpitations  Anxiety      NEW MEDICATIONS STARTED DURING THIS VISIT:  Discharge Medication List as of 04/06/2018  1:59 AM    START taking these medications   Details  LORazepam (ATIVAN) 1 MG tablet Take 1 tablet (1 mg total) by mouth 3 (three) times daily as needed for anxiety., Starting Fri 04/06/2018, Until Sat 04/06/2019, Print         Note:  This document was prepared using Dragon voice recognition software and may include unintentional dictation errors.    Darel Hong, MD 04/09/18 [phone]

## 2018-04-06 NOTE — Discharge Instructions (Signed)
Please use your Ativan only as needed for severe symptoms and make an appointment to follow-up with your primary care physician within a week for recheck.  Return to the emergency department sooner for any concerns whatsoever.  It was a pleasure to take care of you today, and thank you for coming to our emergency department.  If you have any questions or concerns before leaving please ask the nurse to grab me and I'm more than happy to go through your aftercare instructions again.  If you were prescribed any opioid pain medication today such as Norco, Vicodin, Percocet, morphine, hydrocodone, or oxycodone please make sure you do not drive when you are taking this medication as it can alter your ability to drive safely.  If you have any concerns once you are home that you are not improving or are in fact getting worse before you can make it to your follow-up appointment, please do not hesitate to call 911 and come back for further evaluation.  Darel Hong, MD  Results for orders placed or performed during the hospital encounter of 04/06/18  Troponin I - ONCE - STAT  Result Value Ref Range   Troponin I <0.03 <0.03 ng/mL  CBC with Differential  Result Value Ref Range   WBC 6.4 4.0 - 10.5 K/uL   RBC 4.22 3.87 - 5.11 MIL/uL   Hemoglobin 14.4 12.0 - 15.0 g/dL   HCT 41.3 36.0 - 46.0 %   MCV 97.9 80.0 - 100.0 fL   MCH 34.1 (H) 26.0 - 34.0 pg   MCHC 34.9 30.0 - 36.0 g/dL   RDW 13.0 11.5 - 15.5 %   Platelets 348 150 - 400 K/uL   nRBC 0.0 0.0 - 0.2 %   Neutrophils Relative % 61 %   Neutro Abs 4.0 1.7 - 7.7 K/uL   Lymphocytes Relative 29 %   Lymphs Abs 1.8 0.7 - 4.0 K/uL   Monocytes Relative 8 %   Monocytes Absolute 0.5 0.1 - 1.0 K/uL   Eosinophils Relative 1 %   Eosinophils Absolute 0.1 0.0 - 0.5 K/uL   Basophils Relative 1 %   Basophils Absolute 0.1 0.0 - 0.1 K/uL   Immature Granulocytes 0 %   Abs Immature Granulocytes 0.02 0.00 - 0.07 K/uL  Comprehensive metabolic panel  Result Value  Ref Range   Sodium 137 135 - 145 mmol/L   Potassium 3.5 3.5 - 5.1 mmol/L   Chloride 105 98 - 111 mmol/L   CO2 23 22 - 32 mmol/L   Glucose, Bld 120 (H) 70 - 99 mg/dL   BUN 11 6 - 20 mg/dL   Creatinine, Ser 0.50 0.44 - 1.00 mg/dL   Calcium 9.1 8.9 - 10.3 mg/dL   Total Protein 7.5 6.5 - 8.1 g/dL   Albumin 4.5 3.5 - 5.0 g/dL   AST 28 15 - 41 U/L   ALT 31 0 - 44 U/L   Alkaline Phosphatase 70 38 - 126 U/L   Total Bilirubin 0.3 0.3 - 1.2 mg/dL   GFR calc non Af Amer >60 >60 mL/min   GFR calc Af Amer >60 >60 mL/min   Anion gap 9 5 - 15   Dg Chest 2 View  Result Date: 04/05/2018 CLINICAL DATA:  Tachycardia and dyspnea EXAM: CHEST - 2 VIEW COMPARISON:  06/03/2005 chest CT FINDINGS: Normal heart size. Normal mediastinal contour. No pneumothorax. No pleural effusion. Lungs appear clear, with no acute consolidative airspace disease and no pulmonary edema. IMPRESSION: No active cardiopulmonary disease. Electronically  Signed   By: Ilona Sorrel M.D.   On: 04/05/2018 21:52

## 2018-04-06 NOTE — ED Notes (Signed)
Pt verbalized understanding of d/c instructions, rx, and f/u care. No further questions at this time. Pt ambulatory to the exit with steady gait with husband.

## 2018-04-23 DIAGNOSIS — D684 Acquired coagulation factor deficiency: Secondary | ICD-10-CM | POA: Diagnosis not present

## 2018-04-23 DIAGNOSIS — M5440 Lumbago with sciatica, unspecified side: Secondary | ICD-10-CM | POA: Diagnosis not present

## 2018-08-20 DIAGNOSIS — D684 Acquired coagulation factor deficiency: Secondary | ICD-10-CM | POA: Diagnosis not present

## 2018-08-20 DIAGNOSIS — I2699 Other pulmonary embolism without acute cor pulmonale: Secondary | ICD-10-CM | POA: Diagnosis not present

## 2019-03-26 ENCOUNTER — Ambulatory Visit: Payer: 59 | Attending: Internal Medicine

## 2019-03-26 DIAGNOSIS — Z20822 Contact with and (suspected) exposure to covid-19: Secondary | ICD-10-CM

## 2019-03-27 LAB — NOVEL CORONAVIRUS, NAA: SARS-CoV-2, NAA: NOT DETECTED

## 2019-10-18 ENCOUNTER — Other Ambulatory Visit: Payer: 59

## 2019-11-07 ENCOUNTER — Other Ambulatory Visit: Payer: Self-pay | Admitting: Internal Medicine

## 2019-12-19 ENCOUNTER — Other Ambulatory Visit: Payer: Self-pay | Admitting: Internal Medicine

## 2020-01-14 ENCOUNTER — Other Ambulatory Visit: Payer: Self-pay | Admitting: Internal Medicine

## 2020-01-23 ENCOUNTER — Other Ambulatory Visit: Payer: Self-pay | Admitting: Internal Medicine

## 2020-02-24 ENCOUNTER — Other Ambulatory Visit: Payer: Self-pay | Admitting: Internal Medicine

## 2020-04-01 ENCOUNTER — Other Ambulatory Visit: Payer: Self-pay | Admitting: Internal Medicine

## 2020-04-28 ENCOUNTER — Ambulatory Visit (INDEPENDENT_AMBULATORY_CARE_PROVIDER_SITE_OTHER): Payer: 59

## 2020-04-28 ENCOUNTER — Ambulatory Visit
Admission: EM | Admit: 2020-04-28 | Discharge: 2020-04-28 | Disposition: A | Discharge status: Home / Self Care | Payer: 59 | Attending: Family Medicine | Admitting: Family Medicine

## 2020-04-28 DIAGNOSIS — R0602 Shortness of breath: Secondary | ICD-10-CM

## 2020-04-28 DIAGNOSIS — R062 Wheezing: Secondary | ICD-10-CM | POA: Diagnosis not present

## 2020-04-28 DIAGNOSIS — J0110 Acute frontal sinusitis, unspecified: Secondary | ICD-10-CM

## 2020-04-28 DIAGNOSIS — R059 Cough, unspecified: Secondary | ICD-10-CM

## 2020-04-28 MED ORDER — AMOXICILLIN-POT CLAVULANATE 875-125 MG PO TABS: 1.0000 | ORAL_TABLET | Freq: Two times a day (BID) | ORAL | 0 refills | Status: DC

## 2020-04-28 NOTE — Discharge Instructions (Addendum)
Your x ray was normal Treating you for a sinus infection Recommend Flonase nasal spray.  Follow up as needed for continued or worsening symptoms

## 2020-04-28 NOTE — ED Triage Notes (Signed)
Patient presents to Urgent Care with complaints of cough, headache, chills, x 1 month. Pt states chest tightness started last night. Tested positive for covid 01/23.   Denies fever, SOB, or N/V.

## 2020-05-11 ENCOUNTER — Other Ambulatory Visit: Payer: Self-pay | Admitting: Internal Medicine

## 2020-06-23 ENCOUNTER — Other Ambulatory Visit: Payer: Self-pay | Admitting: Internal Medicine

## 2020-07-15 ENCOUNTER — Encounter: Payer: Self-pay | Admitting: Certified Nurse Midwife

## 2020-07-15 ENCOUNTER — Other Ambulatory Visit: Payer: Self-pay

## 2020-07-15 ENCOUNTER — Ambulatory Visit (INDEPENDENT_AMBULATORY_CARE_PROVIDER_SITE_OTHER): Payer: Commercial Managed Care - PPO | Admitting: Certified Nurse Midwife

## 2020-07-15 VITALS — BP 123/85 | HR 87 | Resp 16 | Ht 67.0 in | Wt 166.4 lb

## 2020-07-15 DIAGNOSIS — Z1159 Encounter for screening for other viral diseases: Secondary | ICD-10-CM

## 2020-07-15 DIAGNOSIS — Z1231 Encounter for screening mammogram for malignant neoplasm of breast: Secondary | ICD-10-CM

## 2020-07-15 DIAGNOSIS — R4189 Other symptoms and signs involving cognitive functions and awareness: Secondary | ICD-10-CM

## 2020-07-15 DIAGNOSIS — R404 Transient alteration of awareness: Secondary | ICD-10-CM

## 2020-07-15 DIAGNOSIS — Z124 Encounter for screening for malignant neoplasm of cervix: Secondary | ICD-10-CM

## 2020-07-15 DIAGNOSIS — Z114 Encounter for screening for human immunodeficiency virus [HIV]: Secondary | ICD-10-CM | POA: Diagnosis not present

## 2020-07-15 DIAGNOSIS — R102 Pelvic and perineal pain: Secondary | ICD-10-CM

## 2020-07-15 NOTE — Patient Instructions (Signed)
Pelvic Pain, Female Pelvic pain is pain in your lower belly (abdomen), below your belly button and between your hips. The pain may start suddenly (be acute), keep coming back (be recurring), or last a long time (become chronic). Pelvic pain that lasts longer than 6 months is called chronic pelvic pain. There are many causes of pelvic pain. Sometimes the cause of pelvic pain is not known. Follow these instructions at home:  Take over-the-counter and prescription medicines only as told by your doctor.  Rest as told by your doctor.  Do not have sex if it hurts.  Keep a journal of your pelvic pain. Write down: ? When the pain started. ? Where the pain is located. ? What seems to make the pain better or worse, such as food or your period (menstrual cycle). ? Any symptoms you have along with the pain.  Keep all follow-up visits as told by your doctor. This is important.   Contact a doctor if:  Medicine does not help your pain.  Your pain comes back.  You have new symptoms.  You have unusual discharge or bleeding from your vagina.  You have a fever or chills.  You are having trouble pooping (constipation).  You have blood in your pee (urine) or poop (stool).  Your pee smells bad.  You feel weak or light-headed. Get help right away if:  You have sudden pain that is very bad.  Your pain keeps getting worse.  You have very bad pain and also have any of these symptoms: ? A fever. ? Feeling sick to your stomach (nausea). ? Throwing up (vomiting). ? Being very sweaty.  You pass out (lose consciousness). Summary  Pelvic pain is pain in your lower belly (abdomen), below your belly button and between your hips.  There are many possible causes of pelvic pain.  Keep a journal of your pelvic pain. This information is not intended to replace advice given to you by your health care provider. Make sure you discuss any questions you have with your health care provider. Document  Revised: 09/06/2017 Document Reviewed: 09/06/2017 Elsevier Patient Education  [place].

## 2020-07-15 NOTE — Progress Notes (Signed)
GYN ENCOUNTER NOTE  Subjective:       Latoya [name] is a 50 y.o. G90P0 female is here for gynecologic evaluation of the following issues:  1. Pelvic pain , pt has history of hysterectomy 2016 with Dr. Aileen Pilot for endometriosis. She also had her right ovary removed.   She was seen in 2017 for pelvic pain and suspected adhesions and scar tissue. Her u/s was 05/26/2015 was normal. She complains today of continued pelvic pain . She denies any trama to the area.  2.States that recently with intercourse/orgasim she has episode where her eyes roll back , she is in coherent, she gets pounding pain and pressure in her head, then feels very exhausted. IT has made her and her partner afraid to have intercourse.    Gynecologic History No LMP recorded. Patient has had a hysterectomy. Contraception: status post hysterectomy Last Pap: 02/2014. Results were: normal Last mammogram: 2009. Results were: normal  Obstetric History OB History  Gravida Para Term Preterm AB Living  2         2  SAB IAB Ectopic Multiple Live Births          2    # Outcome Date GA Lbr Len/2nd Weight Sex Delivery Anes PTL Lv  2 Gravida 1990    M Vag-Spont   LIV  1 Gravida 1988    F Vag-Spont   LIV    Past Medical History:  Diagnosis Date  . Anxiety   . Condyloma acuminata   . Depression   . Hematuria   . PE (pulmonary embolism)     Past Surgical History:  Procedure Laterality Date  . ABDOMINAL HYSTERECTOMY    . ABLATION ON ENDOMETRIOSIS  2007  . COLONOSCOPY WITH PROPOFOL N/A 08/09/2016   Procedure: COLONOSCOPY WITH PROPOFOL;  Surgeon: Jonathon Bellows, MD;  Location[facility] ENDOSCOPY;  Service: Endoscopy;  Laterality: N/A;  . RIGHT OOPHORECTOMY    . SALPINGECTOMY Left   . TUBAL LIGATION  1990    Current Outpatient Medications on File Prior to Visit  Medication Sig Dispense Refill  . lisdexamfetamine (VYVANSE) 50 MG capsule Take 1 capsule (50 mg total) by mouth daily. 30 capsule 0  . Venlafaxine HCl 225 MG TB24  Take 1 tablet by mouth once daily 30 tablet 0  . XARELTO 20 MG TABS tablet Take 1 tablet by mouth once daily 30 tablet 0   No current facility-administered medications on file prior to visit.    Allergies  Allergen Reactions  . Biaxin [Clarithromycin] Nausea Only    Social History   Socioeconomic History  . Marital status: Married    Spouse name: Not on file  . Number of children: Not on file  . Years of education: Not on file  . Highest education level: Not on file  Occupational History  . Not on file  Tobacco Use  . Smoking status: Current Every Day Smoker    Packs/day: 0.41    Types: Cigarettes, E-cigarettes  . Smokeless tobacco: Never Used  Vaping Use  . Vaping Use: Every day  Substance and Sexual Activity  . Alcohol use: Yes    Alcohol/week: 21.0 standard drinks    Types: 21 Glasses of wine per week  . Drug use: No  . Sexual activity: Yes    Birth control/protection: Surgical  Other Topics Concern  . Not on file  Social History Narrative  . Not on file   Social Determinants of Health   Financial Resource Strain: Not on file  Food Insecurity: Not on file  Transportation Needs: Not on file  Physical Activity: Not on file  Stress: Not on file  Social Connections: Not on file  Intimate Partner Violence: Not on file    Family History  Problem Relation Age of Onset  . Pulmonary embolism Mother   . Lymphoma Father   . Multiple sclerosis Sister   . Lymphoma Brother     The following portions of the patient's history were reviewed and updated as appropriate: allergies, current medications, past family history, past medical history, past social history, past surgical history and problem list.  Review of Systems Review of Systems - Negative except as mentioned in HPI Review of Systems - General ROS: negative for - chills, fatigue, fever, hot flashes, malaise or night sweats Hematological and Lymphatic ROS: negative for - bleeding problems or swollen lymph  nodes Gastrointestinal ROS: negative for - abdominal pain, blood in stools, change in bowel habits and nausea/vomiting Musculoskeletal ROS: negative for - joint pain, muscle pain or muscular weakness Genito-Urinary ROS: negative for - change in menstrual cycle, dysmenorrhea, dyspareunia, dysuria, genital discharge, genital ulcers, hematuria, incontinence, irregular/heavy menses, nocturia . Positive for  pelvic pain  Objective:   BP 123/85   Pulse 87   Resp 16   Ht 5\' 7"  (1.702 m)   Wt 166 lb 6.4 oz (75.5 kg)   BMI 26.06 kg/m  CONSTITUTIONAL: Well-developed, well-nourished female in no acute distress.  HENT:  Normocephalic, atraumatic.  NECK: Normal range of motion, supple, no masses.  Normal thyroid.  SKIN: Skin is warm and dry. No rash noted. Not diaphoretic. No erythema. No pallor. [place]: Alert and oriented to person, place, and time.  PSYCHIATRIC: Normal mood and affect. Normal behavior. Normal judgment and thought content. CARDIOVASCULAR:Not Examined RESPIRATORY: Not Examined BREASTS: Not Examined ABDOMEN: Soft, non distended; Non tender.  No Organomegaly. PELVIC:  External Genitalia: Normal  BUS: Normal  Vagina: Normal , tenderness with exam  Cervix: Normal  Uterus: absent  Adnexa: Normal, right lower quadrant discomfort with exam  RV: Normal   Bladder: Nontender MUSCULOSKELETAL: Normal range of motion. No tenderness.  No cyanosis, clubbing, or edema.     Assessment:    Pelvic pain  necrologic episode after intercourse  Plan:   Referral placed to neurology, referral placed to pelvic floor therapy for adhesions/scar tissue pain. Pelvic u/s ordered r/o other cause of pelvic pain. Pt encouraged to follow up for annual exam as soon as she is able. Will follow up with u/s results .   Philip Aspen, CNM

## 2020-07-21 ENCOUNTER — Encounter: Payer: Self-pay | Admitting: Neurology

## 2020-07-31 ENCOUNTER — Other Ambulatory Visit: Payer: Self-pay

## 2020-08-03 ENCOUNTER — Other Ambulatory Visit: Payer: Self-pay | Admitting: Internal Medicine

## 2020-09-01 ENCOUNTER — Encounter: Payer: Self-pay | Admitting: Internal Medicine

## 2020-09-01 ENCOUNTER — Other Ambulatory Visit: Payer: Self-pay

## 2020-09-01 ENCOUNTER — Ambulatory Visit (INDEPENDENT_AMBULATORY_CARE_PROVIDER_SITE_OTHER): Payer: 59 | Admitting: Internal Medicine

## 2020-09-01 ENCOUNTER — Ambulatory Visit
Admission: RE | Admit: 2020-09-01 | Discharge: 2020-09-01 | Disposition: A | Discharge status: Home / Self Care | Payer: Commercial Managed Care - PPO | Source: Ambulatory Visit | Attending: Certified Nurse Midwife | Admitting: Certified Nurse Midwife

## 2020-09-01 VITALS — BP 140/94 | HR 79 | Ht 67.0 in | Wt 165.0 lb

## 2020-09-01 DIAGNOSIS — I2699 Other pulmonary embolism without acute cor pulmonale: Secondary | ICD-10-CM

## 2020-09-01 DIAGNOSIS — R102 Pelvic and perineal pain: Secondary | ICD-10-CM | POA: Diagnosis present

## 2020-09-01 DIAGNOSIS — F419 Anxiety disorder, unspecified: Secondary | ICD-10-CM | POA: Diagnosis not present

## 2020-09-01 DIAGNOSIS — Z13220 Encounter for screening for lipoid disorders: Secondary | ICD-10-CM

## 2020-09-01 DIAGNOSIS — F3289 Other specified depressive episodes: Secondary | ICD-10-CM | POA: Diagnosis not present

## 2020-09-01 DIAGNOSIS — Z0000 Encounter for general adult medical examination without abnormal findings: Secondary | ICD-10-CM | POA: Insufficient documentation

## 2020-09-01 DIAGNOSIS — Z1329 Encounter for screening for other suspected endocrine disorder: Secondary | ICD-10-CM

## 2020-09-01 NOTE — Assessment & Plan Note (Signed)
No recurrence. 

## 2020-09-01 NOTE — Assessment & Plan Note (Signed)
Hypercholesterolemia  I advised the patient to follow Mediterranean diet This diet is rich in fruits vegetables and whole grain, and This diet is also rich in fish and lean meat Patient should also eat a handful of almonds or walnuts daily Recent heart study indicated that average follow-up on this kind of diet reduces the cardiovascular mortality by 50 to 70%== 

## 2020-09-01 NOTE — Assessment & Plan Note (Signed)
-   Encouraged patient to engage in relaxing activities like yoga, meditation, journaling, going for a walk, or participating in a hobby.  - Encouraged patient to reach out to trusted friends or family members about recent struggles

## 2020-09-01 NOTE — Assessment & Plan Note (Signed)
Stable at the present time. 

## 2020-09-01 NOTE — Progress Notes (Signed)
Established Patient Office Visit  Subjective:  Patient ID: Latoya [name], female    DOB: 08-Oct-1970  Age: 50 y.o. MRN: [id number]  CC:  Chief Complaint  Patient presents with  . Medication Refill    HPI  Latoya [name] presents forcheck up, she also complains of abdominal pain nausea headache associated with sex.  She is going to see the gynecologist and neurologist for that.  Also going to have an ultrasound of the pelvis today.  She has a history of endometrial surgery with hysterectomy, she also has stomach tuch operation.  Past Medical History:  Diagnosis Date  . Anxiety   . Condyloma acuminata   . Depression   . Hematuria   . PE (pulmonary embolism)     Past Surgical History:  Procedure Laterality Date  . ABDOMINAL HYSTERECTOMY    . ABLATION ON ENDOMETRIOSIS  2007  . COLONOSCOPY WITH PROPOFOL N/A 08/09/2016   Procedure: COLONOSCOPY WITH PROPOFOL;  Surgeon: Jonathon Bellows, MD;  Location: [facility] ENDOSCOPY;  Service: Endoscopy;  Laterality: N/A;  . RIGHT OOPHORECTOMY    . SALPINGECTOMY Left   . TUBAL LIGATION  1990    Family History  Problem Relation Age of Onset  . Pulmonary embolism Mother   . Lymphoma Father   . Multiple sclerosis Sister   . Lymphoma Brother     Social History   Socioeconomic History  . Marital status: Married    Spouse name: Not on file  . Number of children: Not on file  . Years of education: Not on file  . Highest education level: Not on file  Occupational History  . Not on file  Tobacco Use  . Smoking status: Current Every Day Smoker    Packs/day: 0.41    Types: Cigarettes, E-cigarettes  . Smokeless tobacco: Never Used  Vaping Use  . Vaping Use: Every day  Substance and Sexual Activity  . Alcohol use: Yes    Alcohol/week: 21.0 standard drinks    Types: 21 Glasses of wine per week  . Drug use: No  . Sexual activity: Yes    Birth control/protection: Surgical  Other Topics Concern  . Not on file  Social History Narrative   . Not on file   Social Determinants of Health   Financial Resource Strain: Not on file  Food Insecurity: Not on file  Transportation Needs: Not on file  Physical Activity: Not on file  Stress: Not on file  Social Connections: Not on file  Intimate Partner Violence: Not on file     Current Outpatient Medications:  .  lisdexamfetamine (VYVANSE) 50 MG capsule, Take 1 capsule (50 mg total) by mouth daily., Disp: 30 capsule, Rfl: 0 .  Venlafaxine HCl 225 MG TB24, Take 1 tablet by mouth once daily, Disp: 30 tablet, Rfl: 0 .  XARELTO 20 MG TABS tablet, Take 1 tablet by mouth once daily, Disp: 30 tablet, Rfl: 0   Allergies  Allergen Reactions  . Biaxin [Clarithromycin] Nausea Only    ROS Review of Systems  Constitutional: Negative.   HENT: Negative.   Eyes: Negative.   Respiratory: Negative.   Cardiovascular: Negative.   Gastrointestinal: Negative.   Endocrine: Negative.   Genitourinary: Negative.   Musculoskeletal: Negative.   Skin: Negative.   Allergic/Immunologic: Negative.   Neurological: Negative.   Hematological: Negative.   Psychiatric/Behavioral: Negative.   All other systems reviewed and are negative.     Objective:    Physical Exam Vitals reviewed.  Constitutional:  Appearance: Normal appearance.  HENT:     Mouth/Throat:     Mouth: Mucous membranes are moist.  Eyes:     Pupils: Pupils are equal, round, and reactive to light.  Neck:     Vascular: No carotid bruit.  Cardiovascular:     Rate and Rhythm: Normal rate and regular rhythm.     Pulses: Normal pulses.     Heart sounds: Normal heart sounds.  Pulmonary:     Effort: Pulmonary effort is normal.     Breath sounds: Normal breath sounds.  Abdominal:     General: Bowel sounds are normal.     Palpations: Abdomen is soft. There is no hepatomegaly, splenomegaly or mass.     Tenderness: There is no abdominal tenderness.     Hernia: No hernia is present.  Musculoskeletal:        General: No  tenderness.     Cervical back: Neck supple.     Right lower leg: No edema.     Left lower leg: No edema.  Skin:    Findings: No rash.  Neurological:     Mental Status: She is alert and oriented to person, place, and time.     Motor: No weakness.  Psychiatric:        Mood and Affect: Mood and affect normal.        Behavior: Behavior normal.     BP (!) 140/94   Pulse 79   Ht 5\' 7"  (1.702 m)   Wt 165 lb (74.8 kg)   BMI 25.84 kg/m  Wt Readings from Last 3 Encounters:  09/01/20 165 lb (74.8 kg)  07/15/20 166 lb 6.4 oz (75.5 kg)  04/28/20 166 lb (75.3 kg)     Health Maintenance Due  Topic Date Due  . COVID-19 Vaccine (1) Never done  . Hepatitis C Screening  Never done  . TETANUS/TDAP  Never done  . PAP SMEAR-Modifier  02/06/2017  . MAMMOGRAM  Never done  . Zoster Vaccines- Shingrix (1 of 2) Never done    There are no preventive care reminders to display for this patient.  Lab Results  Component Value Date   TSH 4.053 04/05/2018   Lab Results  Component Value Date   WBC 6.4 04/05/2018   HGB 14.4 04/05/2018   HCT 41.3 04/05/2018   MCV 97.9 04/05/2018   PLT 348 04/05/2018   Lab Results  Component Value Date   NA 137 04/05/2018   K 3.5 04/05/2018   CO2 23 04/05/2018   GLUCOSE 120 (H) 04/05/2018   BUN 11 04/05/2018   CREATININE 0.50 04/05/2018   BILITOT 0.3 04/05/2018   ALKPHOS 70 04/05/2018   AST 28 04/05/2018   ALT 31 04/05/2018   PROT 7.5 04/05/2018   ALBUMIN 4.5 04/05/2018   CALCIUM 9.1 04/05/2018   ANIONGAP 9 04/05/2018   No results found for: CHOL No results found for: HDL No results found for: LDLCALC No results found for: TRIG No results found for: CHOLHDL No results found for: HGBA1C    Assessment & Plan:   Problem List Items Addressed This Visit      Cardiovascular and Mediastinum   Pulmonary embolism ([place])    No recurrence.        Other   Anxiety     - Encouraged patient to engage in relaxing activities like yoga, meditation,  journaling, going for a walk, or participating in a hobby.  - Encouraged patient to reach out to trusted friends or family members  about recent struggles      Relevant Orders   CBC with Differential/Platelet   COMPLETE METABOLIC PANEL WITH GFR   Cortisol   Clinical depression - Primary    Stable at the present time      Relevant Orders   COMPLETE METABOLIC PANEL WITH GFR   Cortisol   Need for lipid screening    Hypercholesterolemia  I advised the patient to follow Mediterranean diet This diet is rich in fruits vegetables and whole grain, and This diet is also rich in fish and lean meat Patient should also eat a handful of almonds or walnuts daily Recent heart study indicated that average follow-up on this kind of diet reduces the cardiovascular mortality by 50 to 70%==      Relevant Orders   Lipid panel   Screening for thyroid disorder   Relevant Orders   TSH      No orders of the defined types were placed in this encounter.   Follow-up: No follow-ups on file.    Cletis Athens, MD

## 2020-09-02 ENCOUNTER — Other Ambulatory Visit: Payer: Self-pay | Admitting: Internal Medicine

## 2020-09-02 ENCOUNTER — Other Ambulatory Visit: Payer: Self-pay | Admitting: *Deleted

## 2020-09-02 LAB — COMPLETE METABOLIC PANEL WITH GFR
AG Ratio: 2.1 (calc) (ref 1.0–2.5)
ALT: 23 U/L (ref 6–29)
AST: 21 U/L (ref 10–35)
Albumin: 4.8 g/dL (ref 3.6–5.1)
Alkaline phosphatase (APISO): 58 U/L (ref 37–153)
BUN: 14 mg/dL (ref 7–25)
CO2: 22 mmol/L (ref 20–32)
Calcium: 9.6 mg/dL (ref 8.6–10.4)
Chloride: 101 mmol/L (ref 98–110)
Creat: 0.57 mg/dL (ref 0.50–1.05)
GFR, Est African American: 124 mL/min/{1.73_m2} (ref >=60)
GFR, Est Non African American: 107 mL/min/{1.73_m2} (ref >=60)
Globulin: 2.3 g/dL (calc) (ref 1.9–3.7)
Glucose, Bld: 96 mg/dL (ref 65–99)
Potassium: 4.3 mmol/L (ref 3.5–5.3)
Sodium: 137 mmol/L (ref 135–146)
Total Bilirubin: 0.4 mg/dL (ref 0.2–1.2)
Total Protein: 7.1 g/dL (ref 6.1–8.1)

## 2020-09-02 LAB — LIPID PANEL
Cholesterol: 266 mg/dL — ABNORMAL HIGH (ref <200)
HDL: 106 mg/dL (ref >=50)
LDL Cholesterol (Calc): 136 mg/dL (calc) — ABNORMAL HIGH
Non-HDL Cholesterol (Calc): 160 mg/dL (calc) — ABNORMAL HIGH (ref <130)
Total CHOL/HDL Ratio: 2.5 (calc) (ref <5.0)
Triglycerides: 121 mg/dL (ref <150)

## 2020-09-02 LAB — CORTISOL: Cortisol, Plasma: 7.3 ug/dL

## 2020-09-02 LAB — CBC WITH DIFFERENTIAL/PLATELET
Absolute Monocytes: 368 cells/uL (ref 200–950)
Basophils Absolute: 41 cells/uL (ref 0–200)
Basophils Relative: 0.9 %
Eosinophils Absolute: 138 cells/uL (ref 15–500)
Eosinophils Relative: 3 %
HCT: 40.3 % (ref 35.0–45.0)
Hemoglobin: 13.4 g/dL (ref 11.7–15.5)
Lymphs Abs: 1615 cells/uL (ref 850–3900)
MCH: 32.8 pg (ref 27.0–33.0)
MCHC: 33.3 g/dL (ref 32.0–36.0)
MCV: 98.5 fL (ref 80.0–100.0)
MPV: 9.9 fL (ref 7.5–12.5)
Monocytes Relative: 8 %
Neutro Abs: 2438 cells/uL (ref 1500–7800)
Neutrophils Relative %: 53 %
Platelets: 270 10*3/uL (ref 140–400)
RBC: 4.09 10*6/uL (ref 3.80–5.10)
RDW: 13 % (ref 11.0–15.0)
Total Lymphocyte: 35.1 %
WBC: 4.6 10*3/uL (ref 3.8–10.8)

## 2020-09-02 LAB — TSH: TSH: 2.63 mIU/L

## 2020-09-02 MED ORDER — VENLAFAXINE HCL ER 225 MG PO TB24: 1.0000 | ORAL_TABLET | Freq: Every day | ORAL | 4 refills | Status: DC

## 2020-09-04 ENCOUNTER — Other Ambulatory Visit: Payer: Self-pay

## 2020-09-22 ENCOUNTER — Other Ambulatory Visit: Payer: Self-pay | Admitting: Internal Medicine

## 2020-09-27 NOTE — Progress Notes (Signed)
NEUROLOGY CONSULTATION NOTE  Latoya [name] MRN: [id number] DOB: 30-Dec-1970  Referring provider: Philip Aspen, CNM Primary care provider: Cletis Athens, MD  Reason for consult:  episode of unresponsiveness  Assessment/Plan:   Probable primary headache associated with sexual activity.  Will rule out secondary etiologies - MRI brain and MRA of head and neck. Start propranolol ER 80mg  daily.  We can increase dose to 120mg  daily in 4 weeks if needed. May take indomethacin 50mg  1-2 hours before coitus or as needed.  Caution as it may increase bleeding risk with Xarelto Follow up 4 to 6 months.   Subjective:  Latoya [name] is a 50 year old right-handed female with depression, anxiety and history of PE on Xarelto who presents for episode of unresponsiveness.  History supplemented by referring provider's note.  Since March, she has had severe headaches with sexual activity.  When she is about to climax, she experiences this rush to her head followed by a severe diffuse squeezing headache.  During this time, she notes blurred vision, eyes roll back and she is unable to respond to her husband, but she does not lose consciousness.  She doesn't move because she feels movement will aggravate it.  It lasts about 5 minutes but she is fatigued for about a day.  It occurs 85-90% of the time.  She has occasional tension headaches but denies migraines.   CBC and CMP from 09/01/2020 reviewed.   PAST MEDICAL HISTORY: Past Medical History:  Diagnosis Date   Anxiety    Condyloma acuminata    Depression    Hematuria    PE (pulmonary embolism)     PAST SURGICAL HISTORY: Past Surgical History:  Procedure Laterality Date   ABDOMINAL HYSTERECTOMY     ABLATION ON ENDOMETRIOSIS  2007   COLONOSCOPY WITH PROPOFOL N/A 08/09/2016   Procedure: COLONOSCOPY WITH PROPOFOL;  Surgeon: Jonathon Bellows, MD;  Location[facility] ENDOSCOPY;  Service: Endoscopy;  Laterality: N/A;   RIGHT OOPHORECTOMY      SALPINGECTOMY Left    TUBAL LIGATION  1990    MEDICATIONS: Current Outpatient Medications on File Prior to Visit  Medication Sig Dispense Refill   lisdexamfetamine (VYVANSE) 50 MG capsule Take 1 capsule (50 mg total) by mouth daily. 30 capsule 0   Venlafaxine HCl 225 MG TB24 Take 1 tablet (225 mg total) by mouth daily. 30 tablet 4   XARELTO 20 MG TABS tablet Take 1 tablet by mouth once daily 30 tablet 0   No current facility-administered medications on file prior to visit.    ALLERGIES: Allergies  Allergen Reactions   Biaxin [Clarithromycin] Nausea Only    FAMILY HISTORY: Family History  Problem Relation Age of Onset   Pulmonary embolism Mother    Lymphoma Father    Multiple sclerosis Sister    Lymphoma Brother     Objective:  Blood pressure (!) 147/102, pulse (!) 121, height 5\' 7"  (1.702 m), weight 170 lb 9.6 oz (77.4 kg), SpO2 98 %. General: No acute distress.  Patient appears well-groomed.   Head:  Normocephalic/atraumatic Eyes:  fundi examined but not visualized Neck: supple, no paraspinal tenderness, full range of motion Back: No paraspinal tenderness Heart: regular rate and rhythm Lungs: Clear to auscultation bilaterally. Vascular: No carotid bruits. Neurological Exam: Mental status: alert and oriented to person, place, and time, recent and remote memory intact, fund of knowledge intact, attention and concentration intact, speech fluent and not dysarthric, language intact. Cranial nerves: CN I: not tested CN II: pupils equal,  round and reactive to light, visual fields intact CN III, IV, VI:  full range of motion, no nystagmus, no ptosis CN V: facial sensation intact. CN VII: upper and lower face symmetric CN VIII: hearing intact CN IX, X: gag intact, uvula midline CN XI: sternocleidomastoid and trapezius muscles intact CN XII: tongue midline Bulk & Tone: normal, no fasciculations. Motor:  muscle strength 5/5 throughout Sensation:  Pinprick, temperature and  vibratory sensation intact. Deep Tendon Reflexes:  2+ throughout,  toes downgoing.   Finger to nose testing:  Without dysmetria.   Heel to shin:  Without dysmetria.   Gait:  Normal station and stride.  Romberg negative.    Thank you for allowing me to take part in the care of this patient.  Metta Clines, DO  CC:  Cletis Athens, MD  Philip Aspen, CNM

## 2020-09-28 ENCOUNTER — Other Ambulatory Visit: Payer: Self-pay

## 2020-09-28 ENCOUNTER — Ambulatory Visit: Payer: Commercial Managed Care - PPO | Admitting: Neurology

## 2020-09-28 ENCOUNTER — Encounter: Payer: Self-pay | Admitting: Neurology

## 2020-09-28 VITALS — BP 147/102 | HR 121 | Ht 67.0 in | Wt 170.6 lb

## 2020-09-28 DIAGNOSIS — G4482 Headache associated with sexual activity: Secondary | ICD-10-CM | POA: Diagnosis not present

## 2020-09-28 MED ORDER — INDOMETHACIN 50 MG PO CAPS: 50.0000 mg | ORAL_CAPSULE | Freq: Two times a day (BID) | ORAL | 5 refills | Status: DC | PRN

## 2020-09-28 MED ORDER — PROPRANOLOL HCL ER 80 MG PO CP24: 80.0000 mg | ORAL_CAPSULE | Freq: Every day | ORAL | 5 refills | Status: DC

## 2020-09-28 NOTE — Patient Instructions (Addendum)
Check MRI of brain and MRA of head and neck.  Start propranolol ER 80mg  daily.  We can increase dose in 4 weeks if needed.  Caution for lightheadedness. Take indomethacin 50mg  as needed for headache.  Also consider taking 1-2 hours before sexual activity.  Try to limit use as it may increase bleeding risk with Xarelto. Follow up 4 to 6 months.

## 2020-10-13 ENCOUNTER — Other Ambulatory Visit: Payer: Self-pay | Admitting: Internal Medicine

## 2020-10-15 ENCOUNTER — Ambulatory Visit
Admission: RE | Admit: 2020-10-15 | Discharge: 2020-10-15 | Disposition: A | Discharge status: Home / Self Care | Payer: Commercial Managed Care - PPO | Source: Ambulatory Visit | Attending: Neurology | Admitting: Neurology

## 2020-10-15 ENCOUNTER — Other Ambulatory Visit: Payer: Self-pay

## 2020-10-15 DIAGNOSIS — G4482 Headache associated with sexual activity: Secondary | ICD-10-CM

## 2020-10-15 MED ORDER — GADOBENATE DIMEGLUMINE 529 MG/ML IV SOLN
15.0000 mL | Freq: Once | INTRAVENOUS | Status: AC | PRN
  Administered 2020-10-15: 15 mL via INTRAVENOUS

## 2020-10-19 NOTE — Progress Notes (Signed)
Mychart message received from pt. Replayed with  the results advised pt to call if she has any questions and to schedule a visit.

## 2020-11-11 ENCOUNTER — Other Ambulatory Visit: Payer: Self-pay | Admitting: Internal Medicine

## 2020-11-20 ENCOUNTER — Ambulatory Visit: Payer: Commercial Managed Care - PPO

## 2020-11-27 ENCOUNTER — Ambulatory Visit: Payer: Commercial Managed Care - PPO

## 2020-12-01 ENCOUNTER — Other Ambulatory Visit: Payer: Self-pay | Admitting: Internal Medicine

## 2020-12-15 ENCOUNTER — Other Ambulatory Visit: Payer: Self-pay | Admitting: *Deleted

## 2020-12-15 ENCOUNTER — Other Ambulatory Visit: Payer: Self-pay

## 2020-12-15 ENCOUNTER — Ambulatory Visit: Payer: Commercial Managed Care - PPO | Admitting: Internal Medicine

## 2020-12-15 ENCOUNTER — Encounter: Payer: Self-pay | Admitting: Internal Medicine

## 2020-12-15 VITALS — BP 113/80 | HR 77 | Ht 67.0 in

## 2020-12-15 DIAGNOSIS — Z1329 Encounter for screening for other suspected endocrine disorder: Secondary | ICD-10-CM

## 2020-12-15 DIAGNOSIS — Z13220 Encounter for screening for lipoid disorders: Secondary | ICD-10-CM | POA: Diagnosis not present

## 2020-12-15 DIAGNOSIS — F419 Anxiety disorder, unspecified: Secondary | ICD-10-CM

## 2020-12-15 DIAGNOSIS — F902 Attention-deficit hyperactivity disorder, combined type: Secondary | ICD-10-CM | POA: Insufficient documentation

## 2020-12-15 NOTE — Assessment & Plan Note (Signed)
Under control on medications

## 2020-12-15 NOTE — Progress Notes (Signed)
Established Patient Office Visit  Subjective:  Patient ID: Latoya [name], female    DOB: [birth year]  Age: 50 y.o. MRN: [id number]  CC:  Chief Complaint  Patient presents with   Medication Refill    Patient requesting medication refill     Medication Refill   Latoya [name] presents for for general checkup  Past Medical History:  Diagnosis Date   Anxiety    Condyloma acuminata    Depression    Hematuria    PE (pulmonary embolism)     Past Surgical History:  Procedure Laterality Date   ABDOMINAL HYSTERECTOMY     ABLATION ON ENDOMETRIOSIS  04/04/2005   COLONOSCOPY WITH PROPOFOL N/A 08/09/2016   Procedure: COLONOSCOPY WITH PROPOFOL;  Surgeon: Jonathon Bellows, MD;  Location: [facility] END[facility]COPY;  Service: Endoscopy;  Laterality: N/A;   RIGHT OOPHORECTOMY     SALPINGECTOMY Left    TOTAL ABDOMINAL HYSTERECTOMY  2016   Surgical Pathology completed in 2016   TUBAL LIGATION  04/04/1988    Family History  Problem Relation Age of Onset   Pulmonary embolism Mother    Lymphoma Father    Multiple sclerosis Sister    Lymphoma Brother     Social History   Socioeconomic History   Marital status: Married    Spouse name: Not on file   Number of children: Not on file   Years of education: Not on file   Highest education level: Not on file  Occupational History   Not on file  Tobacco Use   Smoking status: Every Day    Types: E-cigarettes   Smokeless tobacco: Never  Vaping Use   Vaping Use: Every day  Substance and Sexual Activity   Alcohol use: Yes    Alcohol/week: 21.0 standard drinks    Types: 21 Glasses of wine per week   Drug use: No   Sexual activity: Yes    Birth control/protection: Surgical  Other Topics Concern   Not on file  Social History Narrative   Right handed    Social Determinants of Health   Financial Resource Strain: Not on file  Food Insecurity: Not on file  Transportation Needs: Not on file  Physical Activity: Not on file  Stress: Not on  file  Social Connections: Not on file  Intimate Partner Violence: Not on file     Current Outpatient Medications:    indomethacin (INDOCIN) 50 MG capsule, Take 1 capsule (50 mg total) by mouth 2 (two) times daily as needed., Disp: 30 capsule, Rfl: 5   lisdexamfetamine (VYVANSE) 50 MG capsule, Take 1 capsule (50 mg total) by mouth daily., Disp: 30 capsule, Rfl: 0   propranolol ER (INDERAL LA) 80 MG 24 hr capsule, Take 1 capsule (80 mg total) by mouth daily., Disp: 30 capsule, Rfl: 5   Venlafaxine HCl 225 MG TB24, Take 1 tablet (225 mg total) by mouth daily., Disp: 30 tablet, Rfl: 4   XARELTO 20 MG TABS tablet, Take 1 tablet by mouth once daily, Disp: 30 tablet, Rfl: 0   Allergies  Allergen Reactions   Biaxin [Clarithromycin] Nausea Only    ROS Review of Systems  Constitutional: Negative.   HENT: Negative.    Eyes: Negative.   Respiratory: Negative.    Cardiovascular: Negative.   Gastrointestinal: Negative.   Endocrine: Negative.   Genitourinary: Negative.   Musculoskeletal: Negative.   Skin: Negative.   Allergic/Immunologic: Negative.   Neurological: Negative.   Hematological: Negative.   Psychiatric/Behavioral: Negative.    All  other systems reviewed and are negative.    Objective:    Physical Exam  BP 113/80   Pulse 77   Ht '5\' 7"'$  (1.702 m)   BMI 26.72 kg/m  Wt Readings from Last 3 Encounters:  09/28/20 170 lb 9.6 oz (77.4 kg)  09/01/20 165 lb (74.8 kg)  07/15/20 166 lb 6.4 oz (75.5 kg)     Health Maintenance Due  Topic Date Due   COVID-19 Vaccine (1) Never [name]   Pneumococcal Vaccine 24-92 Years old (1 - PCV) Never [name]   Hepatitis C Screening  Never [name]   TETANUS/TDAP  Never [name]   MAMMOGRAM  Never [name]   Zoster Vaccines- Shingrix (1 of 2) Never [name]   INFLUENZA VACCINE  Never [name]    There are no preventive care reminders to display for this patient.  Lab Results  Component Value Date   TSH 2.63 09/01/2020   Lab Results  Component Value Date    WBC 4.6 09/01/2020   HGB 13.4 09/01/2020   HCT 40.3 09/01/2020   MCV 98.5 09/01/2020   PLT 270 09/01/2020   Lab Results  Component Value Date   NA 137 09/01/2020   K 4.3 09/01/2020   CO2 22 09/01/2020   GLUCOSE 96 09/01/2020   BUN 14 09/01/2020   CREATININE 0.57 09/01/2020   BILITOT 0.4 09/01/2020   ALKPHOS 70 04/05/2018   AST 21 09/01/2020   ALT 23 09/01/2020   PROT 7.1 09/01/2020   ALBUMIN 4.5 04/05/2018   CALCIUM 9.6 09/01/2020   ANIONGAP 9 04/05/2018   Lab Results  Component Value Date   CHOL 266 (H) 09/01/2020   Lab Results  Component Value Date   HDL 106 09/01/2020   Lab Results  Component Value Date   LDLCALC 136 (H) 09/01/2020   Lab Results  Component Value Date   TRIG 121 09/01/2020   Lab Results  Component Value Date   CHOLHDL 2.5 09/01/2020   No results found for: HGBA1C    Assessment & Plan:   Problem List Items Addressed This Visit       Other   Anxiety - Primary     Keeping a stress/anxiety diary. This can help you learn what triggers your reaction and then learn ways to manage your response.  Thinking about how you react to certain situations. You may not be able to control everything, but you can control your response.  Making time for activities that help you relax and not feeling guilty about spending your time in this way.  Visual imagery and yoga can help you stay calm and relax.      Need for lipid screening    Hypercholesterolemia  I advised the patient to follow Mediterranean diet This diet is rich in fruits vegetables and whole grain, and This diet is also rich in fish and lean meat Patient should also eat a handful of almonds or walnuts daily Recent heart study indicated that average follow-up on this kind of diet reduces the cardiovascular mortality by 50 to 70%==      Screening for thyroid disorder    Suggest thyroid profile      Attention deficit hyperactivity disorder (ADHD), combined type    Under control  on medications      Patient does not want to use statin No orders of the defined types were placed in this encounter.   Follow-up: No follow-ups on file.    Cletis Athens, MD

## 2020-12-15 NOTE — Assessment & Plan Note (Signed)
Hypercholesterolemia  I advised the patient to follow Mediterranean diet This diet is rich in fruits vegetables and whole grain, and This diet is also rich in fish and lean meat Patient should also eat a handful of almonds or walnuts daily Recent heart study indicated that average follow-up on this kind of diet reduces the cardiovascular mortality by 50 to 70%== 

## 2020-12-15 NOTE — Assessment & Plan Note (Signed)
   Keeping a stress/anxiety diary. This can help you learn what triggers your reaction and then learn ways to manage your response.  Thinking about how you react to certain situations. You may not be able to control everything, but you can control your response.  Making time for activities that help you relax and not feeling guilty about spending your time in this way.  Visual imagery and yoga can help you stay calm and relax. 

## 2020-12-15 NOTE — Assessment & Plan Note (Signed)
Suggest thyroid profile

## 2021-01-14 ENCOUNTER — Other Ambulatory Visit: Payer: Self-pay

## 2021-01-14 MED ORDER — AZITHROMYCIN 250 MG PO TABS: ORAL_TABLET | ORAL | 0 refills | Status: AC

## 2021-01-14 MED ORDER — BENZONATATE 100 MG PO CAPS: 100.0000 mg | ORAL_CAPSULE | Freq: Two times a day (BID) | ORAL | 0 refills | Status: DC | PRN

## 2021-02-14 ENCOUNTER — Other Ambulatory Visit: Payer: Self-pay | Admitting: Internal Medicine

## 2021-03-01 ENCOUNTER — Other Ambulatory Visit: Payer: Self-pay | Admitting: Internal Medicine

## 2021-03-04 ENCOUNTER — Ambulatory Visit: Payer: Commercial Managed Care - PPO | Admitting: Neurology

## 2021-04-30 ENCOUNTER — Other Ambulatory Visit: Payer: Self-pay | Admitting: Neurology

## 2021-04-30 ENCOUNTER — Other Ambulatory Visit: Payer: Self-pay | Admitting: Internal Medicine

## 2021-04-30 NOTE — Telephone Encounter (Signed)
Tried calling pt, No answer. VM full unable to lvm.  Pt to call and schedule a visit to get further refills.

## 2021-05-19 ENCOUNTER — Other Ambulatory Visit: Payer: Self-pay | Admitting: Internal Medicine

## 2021-05-24 ENCOUNTER — Ambulatory Visit (INDEPENDENT_AMBULATORY_CARE_PROVIDER_SITE_OTHER): Payer: Commercial Managed Care - PPO | Admitting: Internal Medicine

## 2021-05-24 ENCOUNTER — Encounter: Payer: Self-pay | Admitting: Internal Medicine

## 2021-05-24 ENCOUNTER — Other Ambulatory Visit: Payer: Self-pay

## 2021-05-24 VITALS — BP 123/83 | HR 83 | Ht 67.0 in | Wt 165.0 lb

## 2021-05-24 DIAGNOSIS — F419 Anxiety disorder, unspecified: Secondary | ICD-10-CM

## 2021-05-24 DIAGNOSIS — F902 Attention-deficit hyperactivity disorder, combined type: Secondary | ICD-10-CM

## 2021-05-24 DIAGNOSIS — Z1329 Encounter for screening for other suspected endocrine disorder: Secondary | ICD-10-CM | POA: Diagnosis not present

## 2021-05-24 DIAGNOSIS — Z13220 Encounter for screening for lipoid disorders: Secondary | ICD-10-CM

## 2021-05-24 MED ORDER — RIVAROXABAN 20 MG PO TABS: 20.0000 mg | ORAL_TABLET | Freq: Every day | ORAL | 3 refills | Status: AC

## 2021-05-24 MED ORDER — PROPRANOLOL HCL ER 80 MG PO CP24: 80.0000 mg | ORAL_CAPSULE | Freq: Every day | ORAL | 3 refills | Status: AC

## 2021-05-24 MED ORDER — VENLAFAXINE HCL ER 225 MG PO TB24: 1.0000 | ORAL_TABLET | Freq: Every day | ORAL | 1 refills | Status: DC

## 2021-05-24 NOTE — Assessment & Plan Note (Signed)
Follow-up on thyroid function test

## 2021-05-24 NOTE — Assessment & Plan Note (Signed)
Medicine,refilled

## 2021-05-24 NOTE — Assessment & Plan Note (Signed)
-   Patient experiencing high levels of anxiety.  - Encouraged patient to engage in relaxing activities like yoga, meditation, journaling, going for a walk, or participating in a hobby.  - Encouraged patient to reach out to trusted friends or family members about recent struggles, Patient was advised to read A book, how to stop worrying and start living, it is good book to read to control  the stress  

## 2021-05-24 NOTE — Assessment & Plan Note (Signed)
Need to check cholesterol for the lipid level

## 2021-05-24 NOTE — Progress Notes (Signed)
Established Patient Office Visit  Subjective:  Patient ID: Latoya [name], female    DOB: 1970/08/15  Age: 51 y.o. MRN: [id number]  CC:  Chief Complaint  Patient presents with   Medication Refill    Medication Refill   [name] presents for denies chest pain or shortness of breath  Past Medical History:  Diagnosis Date   Anxiety    Condyloma acuminata    Depression    Hematuria    PE (pulmonary embolism)     Past Surgical History:  Procedure Laterality Date   ABDOMINAL HYSTERECTOMY     ABLATION ON ENDOMETRIOSIS  04/04/2005   COLONOSCOPY WITH PROPOFOL N/A 08/09/2016   Procedure: COLONOSCOPY WITH PROPOFOL;  Surgeon: Jonathon Bellows, MD;  Location: [facility] ENDOSCOPY;  Service: Endoscopy;  Laterality: N/A;   RIGHT OOPHORECTOMY     SALPINGECTOMY Left    TOTAL ABDOMINAL HYSTERECTOMY  2016   Surgical Pathology completed in 2016   TUBAL LIGATION  04/04/1988    Family History  Problem Relation Age of Onset   Pulmonary embolism Mother    Lymphoma Father    Multiple sclerosis Sister    Lymphoma Brother     Social History   Socioeconomic History   Marital status: Married    Spouse name: Not on file   Number of children: Not on file   Years of education: Not on file   Highest education level: Not on file  Occupational History   Not on file  Tobacco Use   Smoking status: Every Day    Types: E-cigarettes   Smokeless tobacco: Never  Vaping Use   Vaping Use: Every day  Substance and Sexual Activity   Alcohol use: Yes    Alcohol/week: 21.0 standard drinks    Types: 21 Glasses of wine per week   Drug use: No   Sexual activity: Yes    Birth control/protection: Surgical  Other Topics Concern   Not on file  Social History Narrative   Right handed    Social Determinants of Health   Financial Resource Strain: Not on file  Food Insecurity: Not on file  Transportation Needs: Not on file  Physical Activity: Not on file  Stress: Not on file  Social  Connections: Not on file  Intimate Partner Violence: Not on file     Current Outpatient Medications:    lisdexamfetamine (VYVANSE) 50 MG capsule, Take 1 capsule (50 mg total) by mouth daily., Disp: 30 capsule, Rfl: 0   propranolol ER (INDERAL LA) 80 MG 24 hr capsule, Take 1 capsule (80 mg total) by mouth daily., Disp: 90 capsule, Rfl: 3   rivaroxaban (XARELTO) 20 MG TABS tablet, Take 1 tablet (20 mg total) by mouth daily., Disp: 90 tablet, Rfl: 3   Venlafaxine HCl 225 MG TB24, Take 1 tablet (225 mg total) by mouth daily., Disp: 90 tablet, Rfl: 1   Allergies  Allergen Reactions   Biaxin [Clarithromycin] Nausea Only    ROS Review of Systems  Constitutional: Negative.   HENT: Negative.    Eyes: Negative.   Respiratory: Negative.    Cardiovascular: Negative.   Gastrointestinal: Negative.   Endocrine: Negative.   Genitourinary: Negative.   Musculoskeletal: Negative.   Skin: Negative.   Allergic/Immunologic: Negative.   Neurological: Negative.   Hematological: Negative.   Psychiatric/Behavioral: Negative.    All other systems reviewed and are negative.    Objective:    Physical Exam Vitals reviewed.  Constitutional:      Appearance: Normal appearance.  HENT:     Mouth/Throat:     Mouth: Mucous membranes are moist.  Eyes:     Pupils: Pupils are equal, round, and reactive to light.  Neck:     Vascular: No carotid bruit.  Cardiovascular:     Rate and Rhythm: Normal rate and regular rhythm.     Pulses: Normal pulses.     Heart sounds: Normal heart sounds.  Pulmonary:     Effort: Pulmonary effort is normal.     Breath sounds: Normal breath sounds.  Abdominal:     General: Bowel sounds are normal.     Palpations: Abdomen is soft. There is no hepatomegaly, splenomegaly or mass.     Tenderness: There is no abdominal tenderness.     Hernia: No hernia is present.  Musculoskeletal:        General: No tenderness.     Cervical back: Neck supple.     Right lower leg: No  edema.     Left lower leg: No edema.  Skin:    Findings: No rash.  Neurological:     Mental Status: She is alert and oriented to person, place, and time.     Motor: No weakness.  Psychiatric:        Mood and Affect: Mood and affect normal.        Behavior: Behavior normal.    BP 123/83    Pulse 83    Ht 5\' 7"  (1.702 m)    Wt 165 lb (74.8 kg)    BMI 25.84 kg/m  Wt Readings from Last 3 Encounters:  05/24/21 165 lb (74.8 kg)  09/28/20 170 lb 9.6 oz (77.4 kg)  09/01/20 165 lb (74.8 kg)     Health Maintenance Due  Topic Date Due   COVID-19 Vaccine (1) Never done   Hepatitis C Screening  Never done   TETANUS/TDAP  Never done   MAMMOGRAM  Never done   Zoster Vaccines- Shingrix (1 of 2) Never done   INFLUENZA VACCINE  Never done    There are no preventive care reminders to display for this patient.  Lab Results  Component Value Date   TSH 2.63 09/01/2020   Lab Results  Component Value Date   WBC 4.6 09/01/2020   HGB 13.4 09/01/2020   HCT 40.3 09/01/2020   MCV 98.5 09/01/2020   PLT 270 09/01/2020   Lab Results  Component Value Date   NA 137 09/01/2020   K 4.3 09/01/2020   CO2 22 09/01/2020   GLUCOSE 96 09/01/2020   BUN 14 09/01/2020   CREATININE 0.57 09/01/2020   BILITOT 0.4 09/01/2020   ALKPHOS 70 04/05/2018   AST 21 09/01/2020   ALT 23 09/01/2020   PROT 7.1 09/01/2020   ALBUMIN 4.5 04/05/2018   CALCIUM 9.6 09/01/2020   ANIONGAP 9 04/05/2018   Lab Results  Component Value Date   CHOL 266 (H) 09/01/2020   Lab Results  Component Value Date   HDL 106 09/01/2020   Lab Results  Component Value Date   LDLCALC 136 (H) 09/01/2020   Lab Results  Component Value Date   TRIG 121 09/01/2020   Lab Results  Component Value Date   CHOLHDL 2.5 09/01/2020   No results found for: HGBA1C    Assessment & Plan:   Problem List Items Addressed This Visit       Other   Anxiety - Primary    - Patient experiencing high levels of anxiety.  - Encouraged  patient to  engage in relaxing activities like yoga, meditation, journaling, going for a walk, or participating in a hobby.  - Encouraged patient to reach out to trusted friends or family members about recent struggles, Patient was advised to read A book, how to stop worrying and start living, it is good book to read to control  the stress       Relevant Medications   Venlafaxine HCl 225 MG TB24   Need for lipid screening    Need to check cholesterol for the lipid level      Screening for thyroid disorder    Follow-up on thyroid function test      Attention deficit hyperactivity disorder (ADHD), combined type    Medicine,refilled             Meds ordered this encounter  Medications   propranolol ER (INDERAL LA) 80 MG 24 hr capsule    Sig: Take 1 capsule (80 mg total) by mouth daily.    Dispense:  90 capsule    Refill:  3   Venlafaxine HCl 225 MG TB24    Sig: Take 1 tablet (225 mg total) by mouth daily.    Dispense:  90 tablet    Refill:  1   rivaroxaban (XARELTO) 20 MG TABS tablet    Sig: Take 1 tablet (20 mg total) by mouth daily.    Dispense:  90 tablet    Refill:  3    Follow-up: No follow-ups on file.    Cletis Athens, MD

## 2021-09-15 ENCOUNTER — Encounter: Payer: Self-pay | Admitting: Internal Medicine

## 2021-09-15 ENCOUNTER — Ambulatory Visit: Payer: Commercial Managed Care - PPO | Admitting: Internal Medicine

## 2021-09-15 VITALS — BP 127/88 | HR 78 | Ht 67.0 in | Wt 175.0 lb

## 2021-09-15 DIAGNOSIS — F902 Attention-deficit hyperactivity disorder, combined type: Secondary | ICD-10-CM | POA: Diagnosis not present

## 2021-09-15 DIAGNOSIS — F419 Anxiety disorder, unspecified: Secondary | ICD-10-CM

## 2021-09-15 DIAGNOSIS — Z0000 Encounter for general adult medical examination without abnormal findings: Secondary | ICD-10-CM

## 2021-09-15 DIAGNOSIS — H60321 Hemorrhagic otitis externa, right ear: Secondary | ICD-10-CM

## 2021-09-15 DIAGNOSIS — Z13220 Encounter for screening for lipoid disorders: Secondary | ICD-10-CM | POA: Diagnosis not present

## 2021-09-15 MED ORDER — OFLOXACIN 0.3 % OT SOLN: 5.0000 [drp] | Freq: Every day | OTIC | Status: AC

## 2021-09-15 MED ORDER — OFLOXACIN 0.3 % OT SOLN: 5.0000 [drp] | Freq: Two times a day (BID) | OTIC | 0 refills | Status: AC

## 2021-09-15 NOTE — Assessment & Plan Note (Signed)
We will start the patient on ofloxacin,  small amount of blood in the right ear

## 2021-09-15 NOTE — Assessment & Plan Note (Signed)
Labs were done.

## 2021-09-15 NOTE — Assessment & Plan Note (Signed)
-   Patient experiencing high levels of anxiety.  - Encouraged patient to engage in relaxing activities like yoga, meditation, journaling, going for a walk, or participating in a hobby.  - Encouraged patient to reach out to trusted friends or family members about recent struggles, Patient was advised to read A book, how to stop worrying and start living, it is good book to read to control  the stress  

## 2021-09-15 NOTE — Assessment & Plan Note (Signed)
Hypercholesterolemia  I advised the patient to follow Mediterranean diet This diet is rich in fruits vegetables and whole grain, and This diet is also rich in fish and lean meat Patient should also eat a handful of almonds or walnuts daily Recent heart study indicated that average follow-up on this kind of diet reduces the cardiovascular mortality by 50 to 70%== 

## 2021-09-15 NOTE — Progress Notes (Signed)
Established Patient Office Visit  Subjective:  Patient ID: Latoya [name], female    DOB: 08/09/1970  Age: 51 y.o. MRN: [id number]  CC:  Chief Complaint  Patient presents with   Ear Drainage    Patient complains of bleeding from her right ear. Started yesterday.    Ear Drainage     Latoya [name] presents for rt ear bleed  Past Medical History:  Diagnosis Date   Anxiety    Condyloma acuminata    Depression    Hematuria    PE (pulmonary embolism)     Past Surgical History:  Procedure Laterality Date   ABDOMINAL HYSTERECTOMY     ABLATION ON ENDOMETRIOSIS  04/04/2005   COLONOSCOPY WITH PROPOFOL N/A 08/09/2016   Procedure: COLONOSCOPY WITH PROPOFOL;  Surgeon: Jonathon Bellows, MD;  Location: [facility] ENDOSCOPY;  Service: Endoscopy;  Laterality: N/A;   RIGHT OOPHORECTOMY     SALPINGECTOMY Left    TOTAL ABDOMINAL HYSTERECTOMY  2016   Surgical Pathology completed in 2016   TUBAL LIGATION  04/04/1988    Family History  Problem Relation Age of Onset   Pulmonary embolism Mother    Lymphoma Father    Multiple sclerosis Sister    Lymphoma Brother     Social History   Socioeconomic History   Marital status: Married    Spouse name: Not on file   Number of children: Not on file   Years of education: Not on file   Highest education level: Not on file  Occupational History   Not on file  Tobacco Use   Smoking status: Every Day    Types: E-cigarettes   Smokeless tobacco: Never  Vaping Use   Vaping Use: Every day  Substance and Sexual Activity   Alcohol use: Yes    Alcohol/week: 21.0 standard drinks of alcohol    Types: 21 Glasses of wine per week   Drug use: No   Sexual activity: Yes    Birth control/protection: Surgical  Other Topics Concern   Not on file  Social History Narrative   Right handed    Social Determinants of Health   Financial Resource Strain: Not on file  Food Insecurity: Not on file  Transportation Needs: Not on file  Physical Activity:  Not on file  Stress: Not on file  Social Connections: Not on file  Intimate Partner Violence: Not on file     Current Outpatient Medications:    lisdexamfetamine (VYVANSE) 50 MG capsule, Take 1 capsule (50 mg total) by mouth daily., Disp: 30 capsule, Rfl: 0   propranolol ER (INDERAL LA) 80 MG 24 hr capsule, Take 1 capsule (80 mg total) by mouth daily., Disp: 90 capsule, Rfl: 3   rivaroxaban (XARELTO) 20 MG TABS tablet, Take 1 tablet (20 mg total) by mouth daily., Disp: 90 tablet, Rfl: 3   Venlafaxine HCl 225 MG TB24, Take 1 tablet (225 mg total) by mouth daily., Disp: 90 tablet, Rfl: 1   Allergies  Allergen Reactions   Biaxin [Clarithromycin] Nausea Only    ROS Review of Systems  Constitutional: Negative.   HENT: Negative.    Eyes: Negative.   Respiratory: Negative.    Cardiovascular: Negative.   Gastrointestinal: Negative.   Endocrine: Negative.   Genitourinary: Negative.   Musculoskeletal: Negative.   Skin: Negative.   Allergic/Immunologic: Negative.   Neurological: Negative.   Hematological: Negative.   Psychiatric/Behavioral: Negative.    All other systems reviewed and are negative.     Objective:  Physical Exam Vitals reviewed.  Constitutional:      Appearance: Normal appearance.  HENT:     Mouth/Throat:     Mouth: Mucous membranes are moist.  Eyes:     Pupils: Pupils are equal, round, and reactive to light.  Neck:     Vascular: No carotid bruit.  Cardiovascular:     Rate and Rhythm: Normal rate and regular rhythm.     Pulses: Normal pulses.     Heart sounds: Normal heart sounds.  Pulmonary:     Effort: Pulmonary effort is normal.     Breath sounds: Normal breath sounds.  Abdominal:     General: Bowel sounds are normal.     Palpations: Abdomen is soft. There is no hepatomegaly, splenomegaly or mass.     Tenderness: There is no abdominal tenderness.     Hernia: No hernia is present.  Musculoskeletal:        General: No tenderness.     Cervical  back: Neck supple.     Right lower leg: No edema.     Left lower leg: No edema.  Skin:    Findings: No rash.  Neurological:     Mental Status: She is alert and oriented to person, place, and time.     Motor: No weakness.  Psychiatric:        Mood and Affect: Mood and affect normal.        Behavior: Behavior normal.     BP 127/88   Pulse 78   Ht '5\' 7"'$  (1.702 m)   Wt 175 lb (79.4 kg)   BMI 27.41 kg/m  Wt Readings from Last 3 Encounters:  09/15/21 175 lb (79.4 kg)  05/24/21 165 lb (74.8 kg)  09/28/20 170 lb 9.6 oz (77.4 kg)     Health Maintenance Due  Topic Date Due   COVID-19 Vaccine (1) Never done   Hepatitis C Screening  Never done   TETANUS/TDAP  Never done   MAMMOGRAM  Never done   Zoster Vaccines- Shingrix (1 of 2) Never done   COLONOSCOPY (Pts 45-77yr Insurance coverage will need to be confirmed)  08/09/2021    There are no preventive care reminders to display for this patient.  Lab Results  Component Value Date   TSH 2.63 09/01/2020   Lab Results  Component Value Date   WBC 4.6 09/01/2020   HGB 13.4 09/01/2020   HCT 40.3 09/01/2020   MCV 98.5 09/01/2020   PLT 270 09/01/2020   Lab Results  Component Value Date   NA 137 09/01/2020   K 4.3 09/01/2020   CO2 22 09/01/2020   GLUCOSE 96 09/01/2020   BUN 14 09/01/2020   CREATININE 0.57 09/01/2020   BILITOT 0.4 09/01/2020   ALKPHOS 70 04/05/2018   AST 21 09/01/2020   ALT 23 09/01/2020   PROT 7.1 09/01/2020   ALBUMIN 4.5 04/05/2018   CALCIUM 9.6 09/01/2020   ANIONGAP 9 04/05/2018   Lab Results  Component Value Date   CHOL 266 (H) 09/01/2020   Lab Results  Component Value Date   HDL 106 09/01/2020   Lab Results  Component Value Date   LDLCALC 136 (H) 09/01/2020   Lab Results  Component Value Date   TRIG 121 09/01/2020   Lab Results  Component Value Date   CHOLHDL 2.5 09/01/2020   No results found for: "HGBA1C"    Assessment & Plan:   Problem List Items Addressed This Visit        Nervous and  Auditory   Acute hemorrhagic otitis externa of right ear    We will start the patient on ofloxacin,  small amount of blood in the right ear        Other   Anxiety    - Patient experiencing high levels of anxiety.  - Encouraged patient to engage in relaxing activities like yoga, meditation, journaling, going for a walk, or participating in a hobby.  - Encouraged patient to reach out to trusted friends or family members about recent struggles, Patient was advised to read A book, how to stop worrying and start living, it is good book to read to control  the stress       Need for lipid screening    Hypercholesterolemia  I advised the patient to follow Mediterranean diet This diet is rich in fruits vegetables and whole grain, and This diet is also rich in fish and lean meat Patient should also eat a handful of almonds or walnuts daily Recent heart study indicated that average follow-up on this kind of diet reduces the cardiovascular mortality by 50 to 70%==      Annual physical exam - Primary    Labs were done      Relevant Orders   CBC with Differential/Platelet   COMPLETE METABOLIC PANEL WITH GFR   TSH   Lipid panel   Hepatitis C Antibody   Attention deficit hyperactivity disorder (ADHD), combined type    We given a prescription for Vyvanse       No orders of the defined types were placed in this encounter.   Follow-up: No follow-ups on file.    Cletis Athens, MD

## 2021-09-15 NOTE — Assessment & Plan Note (Signed)
We given a prescription for Vyvanse

## 2021-09-16 LAB — CBC WITH DIFFERENTIAL/PLATELET
Absolute Monocytes: 400 cells/uL (ref 200–950)
Basophils Absolute: 40 cells/uL (ref 0–200)
Basophils Relative: 1 %
Eosinophils Absolute: 268 cells/uL (ref 15–500)
Eosinophils Relative: 6.7 %
HCT: 40.1 % (ref 35.0–45.0)
Hemoglobin: 13.6 g/dL (ref 11.7–15.5)
Lymphs Abs: 1756 cells/uL (ref 850–3900)
MCH: 34 pg — ABNORMAL HIGH (ref 27.0–33.0)
MCHC: 33.9 g/dL (ref 32.0–36.0)
MCV: 100.3 fL — ABNORMAL HIGH (ref 80.0–100.0)
MPV: 10 fL (ref 7.5–12.5)
Monocytes Relative: 10 %
Neutro Abs: 1536 cells/uL (ref 1500–7800)
Neutrophils Relative %: 38.4 %
Platelets: 273 10*3/uL (ref 140–400)
RBC: 4 10*6/uL (ref 3.80–5.10)
RDW: 13.4 % (ref 11.0–15.0)
Total Lymphocyte: 43.9 %
WBC: 4 10*3/uL (ref 3.8–10.8)

## 2021-09-16 LAB — COMPLETE METABOLIC PANEL WITH GFR
AG Ratio: 1.6 (calc) (ref 1.0–2.5)
ALT: 25 U/L (ref 6–29)
AST: 22 U/L (ref 10–35)
Albumin: 4.2 g/dL (ref 3.6–5.1)
Alkaline phosphatase (APISO): 66 U/L (ref 37–153)
BUN: 13 mg/dL (ref 7–25)
CO2: 22 mmol/L (ref 20–32)
Calcium: 9.4 mg/dL (ref 8.6–10.4)
Chloride: 101 mmol/L (ref 98–110)
Creat: 0.69 mg/dL (ref 0.50–1.03)
Globulin: 2.7 g/dL (calc) (ref 1.9–3.7)
Glucose, Bld: 90 mg/dL (ref 65–99)
Potassium: 4.5 mmol/L (ref 3.5–5.3)
Sodium: 138 mmol/L (ref 135–146)
Total Bilirubin: 0.6 mg/dL (ref 0.2–1.2)
Total Protein: 6.9 g/dL (ref 6.1–8.1)
eGFR: 104 mL/min/{1.73_m2} (ref >=60)

## 2021-09-16 LAB — LIPID PANEL
Cholesterol: 264 mg/dL — ABNORMAL HIGH (ref <200)
HDL: 80 mg/dL (ref >=50)
LDL Cholesterol (Calc): 147 mg/dL (calc) — ABNORMAL HIGH
Non-HDL Cholesterol (Calc): 184 mg/dL (calc) — ABNORMAL HIGH (ref <130)
Total CHOL/HDL Ratio: 3.3 (calc) (ref <5.0)
Triglycerides: 220 mg/dL — ABNORMAL HIGH (ref <150)

## 2021-09-16 LAB — TSH: TSH: 3.61 mIU/L

## 2021-09-16 LAB — HEPATITIS C ANTIBODY
Hepatitis C Ab: NONREACTIVE
SIGNAL TO CUT-OFF: 0.03 (ref <1.00)

## 2021-09-22 ENCOUNTER — Ambulatory Visit (INDEPENDENT_AMBULATORY_CARE_PROVIDER_SITE_OTHER): Payer: Commercial Managed Care - PPO | Admitting: Internal Medicine

## 2021-09-22 ENCOUNTER — Encounter: Payer: Self-pay | Admitting: Internal Medicine

## 2021-09-22 VITALS — BP 139/99 | HR 87 | Ht 67.0 in | Wt 175.0 lb

## 2021-09-22 DIAGNOSIS — F419 Anxiety disorder, unspecified: Secondary | ICD-10-CM

## 2021-09-22 DIAGNOSIS — H60321 Hemorrhagic otitis externa, right ear: Secondary | ICD-10-CM

## 2021-09-22 DIAGNOSIS — F902 Attention-deficit hyperactivity disorder, combined type: Secondary | ICD-10-CM

## 2021-09-22 DIAGNOSIS — E785 Hyperlipidemia, unspecified: Secondary | ICD-10-CM

## 2021-09-22 MED ORDER — ROSUVASTATIN CALCIUM 20 MG PO TABS: 20.0000 mg | ORAL_TABLET | Freq: Every day | ORAL | 3 refills | Status: AC

## 2021-09-22 NOTE — Assessment & Plan Note (Signed)
Stable

## 2021-09-22 NOTE — Assessment & Plan Note (Signed)
There is no further bleeding from the right ear she was advised to continue using the eardrops for another 5 days if she is has more problems she should go to ENT

## 2021-09-22 NOTE — Assessment & Plan Note (Signed)
Stable at the present time. 

## 2021-09-22 NOTE — Assessment & Plan Note (Signed)
Hypercholesterolemia  I advised the patient to follow Mediterranean diet This diet is rich in fruits vegetables and whole grain, and This diet is also rich in fish and lean meat Patient should also eat a handful of almonds or walnuts daily Recent heart study indicated that average follow-up on this kind of diet reduces the cardiovascular mortality by 50 to 70%== 

## 2021-09-22 NOTE — Progress Notes (Signed)
Established Patient Office Visit  Subjective:  Patient ID: Latoya [name], female    DOB: December 17, 1970  Age: 51 y.o. MRN: [id number]  CC:  Chief Complaint  Patient presents with   Follow-up    HPI  Latoya [name] presents for follow-up on the lab test.,  Her cholesterol is high so she will be started on Crestor.  Patient was advised to cut down on her drinking to 1 glass a day and skip Saturday and Sunday  Past Medical History:  Diagnosis Date   Anxiety    Condyloma acuminata    Depression    Hematuria    PE (pulmonary embolism)     Past Surgical History:  Procedure Laterality Date   ABDOMINAL HYSTERECTOMY     ABLATION ON ENDOMETRIOSIS  04/04/2005   COLONOSCOPY WITH PROPOFOL N/A 08/09/2016   Procedure: COLONOSCOPY WITH PROPOFOL;  Surgeon: Jonathon Bellows, MD;  Location: [facility] ENDOSCOPY;  Service: Endoscopy;  Laterality: N/A;   RIGHT OOPHORECTOMY     SALPINGECTOMY Left    TOTAL ABDOMINAL HYSTERECTOMY  2016   Surgical Pathology completed in 2016   TUBAL LIGATION  04/04/1988    Family History  Problem Relation Age of Onset   Pulmonary embolism Mother    Lymphoma Father    Multiple sclerosis Sister    Lymphoma Brother     Social History   Socioeconomic History   Marital status: Married    Spouse name: Not on file   Number of children: Not on file   Years of education: Not on file   Highest education level: Not on file  Occupational History   Not on file  Tobacco Use   Smoking status: Every Day    Types: E-cigarettes   Smokeless tobacco: Never  Vaping Use   Vaping Use: Every day  Substance and Sexual Activity   Alcohol use: Yes    Alcohol/week: 21.0 standard drinks of alcohol    Types: 21 Glasses of wine per week   Drug use: No   Sexual activity: Yes    Birth control/protection: Surgical  Other Topics Concern   Not on file  Social History Narrative   Right handed    Social Determinants of Health   Financial Resource Strain: Not on file  Food  Insecurity: Not on file  Transportation Needs: Not on file  Physical Activity: Not on file  Stress: Not on file  Social Connections: Not on file  Intimate Partner Violence: Not on file     Current Outpatient Medications:    lisdexamfetamine (VYVANSE) 50 MG capsule, Take 1 capsule (50 mg total) by mouth daily., Disp: 30 capsule, Rfl: 0   ofloxacin (FLOXIN OTIC) 0.3 % OTIC solution, Place 5 drops into the right ear 2 (two) times daily., Disp: 5 mL, Rfl: 0   propranolol ER (INDERAL LA) 80 MG 24 hr capsule, Take 1 capsule (80 mg total) by mouth daily., Disp: 90 capsule, Rfl: 3   rivaroxaban (XARELTO) 20 MG TABS tablet, Take 1 tablet (20 mg total) by mouth daily., Disp: 90 tablet, Rfl: 3   rosuvastatin (CRESTOR) 20 MG tablet, Take 1 tablet (20 mg total) by mouth daily., Disp: 90 tablet, Rfl: 3   Venlafaxine HCl 225 MG TB24, Take 1 tablet (225 mg total) by mouth daily., Disp: 90 tablet, Rfl: 1   Allergies  Allergen Reactions   Biaxin [Clarithromycin] Nausea Only    ROS Review of Systems  Constitutional: Negative.   HENT: Negative.    Eyes: Negative.  Respiratory: Negative.    Cardiovascular: Negative.   Gastrointestinal: Negative.   Endocrine: Negative.   Genitourinary: Negative.   Musculoskeletal: Negative.   Skin: Negative.   Allergic/Immunologic: Negative.   Neurological: Negative.   Hematological: Negative.   Psychiatric/Behavioral: Negative.    All other systems reviewed and are negative.     Objective:    Physical Exam Vitals reviewed.  Constitutional:      Appearance: Normal appearance.  HENT:     Head:     Comments:  there is a small blood clot in the right ear there is no further bleeding no infection    Mouth/Throat:     Mouth: Mucous membranes are moist.  Eyes:     Pupils: Pupils are equal, round, and reactive to light.  Neck:     Vascular: No carotid bruit.  Cardiovascular:     Rate and Rhythm: Normal rate and regular rhythm.     Pulses: Normal  pulses.     Heart sounds: Normal heart sounds.  Pulmonary:     Effort: Pulmonary effort is normal.     Breath sounds: Normal breath sounds.  Abdominal:     General: Bowel sounds are normal.     Palpations: Abdomen is soft. There is no hepatomegaly, splenomegaly or mass.     Tenderness: There is no abdominal tenderness.     Hernia: No hernia is present.  Musculoskeletal:        General: No tenderness.     Cervical back: Neck supple.     Right lower leg: No edema.     Left lower leg: No edema.  Skin:    Findings: No rash.  Neurological:     Mental Status: She is alert and oriented to person, place, and time.     Motor: No weakness.  Psychiatric:        Mood and Affect: Mood and affect normal.        Behavior: Behavior normal.     BP (!) 139/99   Pulse 87   Ht 5' 7"  (1.702 m)   Wt 175 lb (79.4 kg)   BMI 27.41 kg/m  Wt Readings from Last 3 Encounters:  09/22/21 175 lb (79.4 kg)  09/15/21 175 lb (79.4 kg)  05/24/21 165 lb (74.8 kg)     Health Maintenance Due  Topic Date Due   COVID-19 Vaccine (1) Never done   TETANUS/TDAP  Never done   MAMMOGRAM  Never done   Zoster Vaccines- Shingrix (1 of 2) Never done   COLONOSCOPY (Pts 45-2yr Insurance coverage will need to be confirmed)  08/09/2021    There are no preventive care reminders to display for this patient.  Lab Results  Component Value Date   TSH 3.61 09/15/2021   Lab Results  Component Value Date   WBC 4.0 09/15/2021   HGB 13.6 09/15/2021   HCT 40.1 09/15/2021   MCV 100.3 (H) 09/15/2021   PLT 273 09/15/2021   Lab Results  Component Value Date   NA 138 09/15/2021   K 4.5 09/15/2021   CO2 22 09/15/2021   GLUCOSE 90 09/15/2021   BUN 13 09/15/2021   CREATININE 0.69 09/15/2021   BILITOT 0.6 09/15/2021   ALKPHOS 70 04/05/2018   AST 22 09/15/2021   ALT 25 09/15/2021   PROT 6.9 09/15/2021   ALBUMIN 4.5 04/05/2018   CALCIUM 9.4 09/15/2021   ANIONGAP 9 04/05/2018   EGFR 104 09/15/2021   Lab Results   Component Value Date   CHOL  264 (H) 09/15/2021   Lab Results  Component Value Date   HDL 80 09/15/2021   Lab Results  Component Value Date   LDLCALC 147 (H) 09/15/2021   Lab Results  Component Value Date   TRIG 220 (H) 09/15/2021   Lab Results  Component Value Date   CHOLHDL 3.3 09/15/2021   No results found for: "HGBA1C"    Assessment & Plan:   Problem List Items Addressed This Visit       Nervous and Auditory   Acute hemorrhagic otitis externa of right ear - Primary    There is no further bleeding from the right ear she was advised to continue using the eardrops for another 5 days if she is has more problems she should go to ENT        Other   Anxiety    Stable at the present time      Attention deficit hyperactivity disorder (ADHD), combined type    Stable      Dyslipidemia    Hypercholesterolemia  I advised the patient to follow Mediterranean diet This diet is rich in fruits vegetables and whole grain, and This diet is also rich in fish and lean meat Patient should also eat a handful of almonds or walnuts daily Recent heart study indicated that average follow-up on this kind of diet reduces the cardiovascular mortality by 50 to 70%==      Relevant Medications   rosuvastatin (CRESTOR) 20 MG tablet    Meds ordered this encounter  Medications   rosuvastatin (CRESTOR) 20 MG tablet    Sig: Take 1 tablet (20 mg total) by mouth daily.    Dispense:  90 tablet    Refill:  3    Follow-up: No follow-ups on file.    Cletis Athens, MD

## 2021-12-11 ENCOUNTER — Other Ambulatory Visit: Payer: Self-pay | Admitting: *Deleted

## 2021-12-11 DIAGNOSIS — Z1231 Encounter for screening mammogram for malignant neoplasm of breast: Secondary | ICD-10-CM

## 2021-12-13 ENCOUNTER — Ambulatory Visit: Payer: Commercial Managed Care - PPO | Admitting: Internal Medicine

## 2021-12-20 ENCOUNTER — Other Ambulatory Visit: Payer: Self-pay | Admitting: Internal Medicine

## 2021-12-23 ENCOUNTER — Emergency Department: Payer: Commercial Managed Care - PPO

## 2021-12-23 ENCOUNTER — Inpatient Hospital Stay: Payer: Commercial Managed Care - PPO

## 2021-12-23 ENCOUNTER — Inpatient Hospital Stay
Admission: EM | Admit: 2021-12-23 | Discharge: 2022-01-02 | DRG: 917 | Disposition: E | Payer: Commercial Managed Care - PPO | Attending: Pulmonary Disease | Admitting: Pulmonary Disease

## 2021-12-23 DIAGNOSIS — R569 Unspecified convulsions: Secondary | ICD-10-CM | POA: Diagnosis not present

## 2021-12-23 DIAGNOSIS — T447X2A Poisoning by beta-adrenoreceptor antagonists, intentional self-harm, initial encounter: Principal | ICD-10-CM | POA: Diagnosis present

## 2021-12-23 DIAGNOSIS — G931 Anoxic brain damage, not elsewhere classified: Secondary | ICD-10-CM | POA: Diagnosis present

## 2021-12-23 DIAGNOSIS — Z7901 Long term (current) use of anticoagulants: Secondary | ICD-10-CM

## 2021-12-23 DIAGNOSIS — Z90710 Acquired absence of both cervix and uterus: Secondary | ICD-10-CM

## 2021-12-23 DIAGNOSIS — Z66 Do not resuscitate: Secondary | ICD-10-CM | POA: Diagnosis not present

## 2021-12-23 DIAGNOSIS — I469 Cardiac arrest, cause unspecified: Secondary | ICD-10-CM

## 2021-12-23 DIAGNOSIS — F419 Anxiety disorder, unspecified: Secondary | ICD-10-CM | POA: Diagnosis present

## 2021-12-23 DIAGNOSIS — T50902A Poisoning by unspecified drugs, medicaments and biological substances, intentional self-harm, initial encounter: Secondary | ICD-10-CM | POA: Diagnosis not present

## 2021-12-23 DIAGNOSIS — R34 Anuria and oliguria: Secondary | ICD-10-CM | POA: Diagnosis present

## 2021-12-23 DIAGNOSIS — Z807 Family history of other malignant neoplasms of lymphoid, hematopoietic and related tissues: Secondary | ICD-10-CM

## 2021-12-23 DIAGNOSIS — Z8249 Family history of ischemic heart disease and other diseases of the circulatory system: Secondary | ICD-10-CM

## 2021-12-23 DIAGNOSIS — N179 Acute kidney failure, unspecified: Secondary | ICD-10-CM | POA: Diagnosis present

## 2021-12-23 DIAGNOSIS — K7200 Acute and subacute hepatic failure without coma: Secondary | ICD-10-CM | POA: Diagnosis present

## 2021-12-23 DIAGNOSIS — R001 Bradycardia, unspecified: Secondary | ICD-10-CM

## 2021-12-23 DIAGNOSIS — J9602 Acute respiratory failure with hypercapnia: Secondary | ICD-10-CM | POA: Diagnosis present

## 2021-12-23 DIAGNOSIS — F1020 Alcohol dependence, uncomplicated: Secondary | ICD-10-CM | POA: Diagnosis present

## 2021-12-23 DIAGNOSIS — E785 Hyperlipidemia, unspecified: Secondary | ICD-10-CM | POA: Diagnosis present

## 2021-12-23 DIAGNOSIS — G40409 Other generalized epilepsy and epileptic syndromes, not intractable, without status epilepticus: Secondary | ICD-10-CM | POA: Diagnosis not present

## 2021-12-23 DIAGNOSIS — G9341 Metabolic encephalopathy: Secondary | ICD-10-CM | POA: Diagnosis present

## 2021-12-23 DIAGNOSIS — Z515 Encounter for palliative care: Secondary | ICD-10-CM

## 2021-12-23 DIAGNOSIS — I4510 Unspecified right bundle-branch block: Secondary | ICD-10-CM | POA: Diagnosis present

## 2021-12-23 DIAGNOSIS — F32A Depression, unspecified: Secondary | ICD-10-CM | POA: Diagnosis present

## 2021-12-23 DIAGNOSIS — E875 Hyperkalemia: Secondary | ICD-10-CM | POA: Diagnosis present

## 2021-12-23 DIAGNOSIS — G935 Compression of brain: Secondary | ICD-10-CM | POA: Diagnosis present

## 2021-12-23 DIAGNOSIS — I952 Hypotension due to drugs: Secondary | ICD-10-CM | POA: Diagnosis present

## 2021-12-23 DIAGNOSIS — Y902 Blood alcohol level of 40-59 mg/100 ml: Secondary | ICD-10-CM | POA: Diagnosis present

## 2021-12-23 DIAGNOSIS — Z7189 Other specified counseling: Secondary | ICD-10-CM | POA: Diagnosis not present

## 2021-12-23 DIAGNOSIS — T50901A Poisoning by unspecified drugs, medicaments and biological substances, accidental (unintentional), initial encounter: Secondary | ICD-10-CM | POA: Diagnosis present

## 2021-12-23 DIAGNOSIS — I468 Cardiac arrest due to other underlying condition: Secondary | ICD-10-CM | POA: Diagnosis present

## 2021-12-23 DIAGNOSIS — Z20822 Contact with and (suspected) exposure to covid-19: Secondary | ICD-10-CM | POA: Diagnosis present

## 2021-12-23 DIAGNOSIS — Z820 Family history of epilepsy and other diseases of the nervous system: Secondary | ICD-10-CM

## 2021-12-23 DIAGNOSIS — J9601 Acute respiratory failure with hypoxia: Secondary | ICD-10-CM | POA: Diagnosis present

## 2021-12-23 DIAGNOSIS — G936 Cerebral edema: Secondary | ICD-10-CM | POA: Diagnosis present

## 2021-12-23 DIAGNOSIS — F17290 Nicotine dependence, other tobacco product, uncomplicated: Secondary | ICD-10-CM | POA: Diagnosis present

## 2021-12-23 DIAGNOSIS — E872 Acidosis, unspecified: Secondary | ICD-10-CM | POA: Diagnosis present

## 2021-12-23 DIAGNOSIS — Z90721 Acquired absence of ovaries, unilateral: Secondary | ICD-10-CM

## 2021-12-23 DIAGNOSIS — Z881 Allergy status to other antibiotic agents status: Secondary | ICD-10-CM

## 2021-12-23 DIAGNOSIS — Z79899 Other long term (current) drug therapy: Secondary | ICD-10-CM

## 2021-12-23 DIAGNOSIS — Z86711 Personal history of pulmonary embolism: Secondary | ICD-10-CM

## 2021-12-23 LAB — COMPREHENSIVE METABOLIC PANEL
ALT: 317 U/L — ABNORMAL HIGH (ref 0–44)
AST: 376 U/L — ABNORMAL HIGH (ref 15–41)
Albumin: 2.8 g/dL — ABNORMAL LOW (ref 3.5–5.0)
Alkaline Phosphatase: 53 U/L (ref 38–126)
Anion gap: 16 — ABNORMAL HIGH (ref 5–15)
BUN: 18 mg/dL (ref 6–20)
CO2: 18 mmol/L — ABNORMAL LOW (ref 22–32)
Calcium: 8.4 mg/dL — ABNORMAL LOW (ref 8.9–10.3)
Chloride: 105 mmol/L (ref 98–111)
Creatinine, Ser: 1.29 mg/dL — ABNORMAL HIGH (ref 0.44–1.00)
GFR, Estimated: 50 mL/min — ABNORMAL LOW (ref >60)
Glucose, Bld: 363 mg/dL — ABNORMAL HIGH (ref 70–99)
Potassium: 6.2 mmol/L — ABNORMAL HIGH (ref 3.5–5.1)
Sodium: 139 mmol/L (ref 135–145)
Total Bilirubin: 0.9 mg/dL (ref 0.3–1.2)
Total Protein: 5.2 g/dL — ABNORMAL LOW (ref 6.5–8.1)

## 2021-12-23 LAB — BLOOD GAS, VENOUS
Acid-Base Excess: 0.1 mmol/L (ref 0.0–2.0)
Bicarbonate: 27.9 mmol/L (ref 20.0–28.0)
FIO2: 80 %
MECHVT: 500 mL
Mechanical Rate: 24
O2 Saturation: 81.2 %
PEEP: 10 cmH2O
Patient temperature: 37
pCO2, Ven: 58 mmHg (ref 44–60)
pH, Ven: 7.29 (ref 7.25–7.43)
pO2, Ven: 49 mmHg — ABNORMAL HIGH (ref 32–45)

## 2021-12-23 LAB — CBC WITH DIFFERENTIAL/PLATELET
Abs Immature Granulocytes: 0.23 10*3/uL — ABNORMAL HIGH (ref 0.00–0.07)
Basophils Absolute: 0 10*3/uL (ref 0.0–0.1)
Basophils Relative: 0 %
Eosinophils Absolute: 0.1 10*3/uL (ref 0.0–0.5)
Eosinophils Relative: 1 %
HCT: 38.4 % (ref 36.0–46.0)
Hemoglobin: 12.3 g/dL (ref 12.0–15.0)
Immature Granulocytes: 3 %
Lymphocytes Relative: 45 %
Lymphs Abs: 4.2 10*3/uL — ABNORMAL HIGH (ref 0.7–4.0)
MCH: 33.9 pg (ref 26.0–34.0)
MCHC: 32 g/dL (ref 30.0–36.0)
MCV: 105.8 fL — ABNORMAL HIGH (ref 80.0–100.0)
Monocytes Absolute: 0.7 10*3/uL (ref 0.1–1.0)
Monocytes Relative: 7 %
Neutro Abs: 4 10*3/uL (ref 1.7–7.7)
Neutrophils Relative %: 44 %
Platelets: 219 10*3/uL (ref 150–400)
RBC: 3.63 MIL/uL — ABNORMAL LOW (ref 3.87–5.11)
RDW: 13.1 % (ref 11.5–15.5)
WBC: 9.2 10*3/uL (ref 4.0–10.5)
nRBC: 0 % (ref 0.0–0.2)

## 2021-12-23 LAB — LACTIC ACID, PLASMA
Lactic Acid, Venous: 6.7 mmol/L (ref 0.5–1.9)
Lactic Acid, Venous: 3.4 mmol/L (ref 0.5–1.9)

## 2021-12-23 LAB — BLOOD GAS, ARTERIAL
Acid-base deficit: 8.8 mmol/L — ABNORMAL HIGH (ref 0.0–2.0)
Bicarbonate: 22.9 mmol/L (ref 20.0–28.0)
FIO2: 100 %
MECHVT: 500 mL
Mechanical Rate: 14
O2 Saturation: 92.5 %
PEEP: 10 cmH2O
Patient temperature: 37
pCO2 arterial: 79 mmHg (ref 32–48)
pH, Arterial: 7.07 — CL (ref 7.35–7.45)
pO2, Arterial: 75 mmHg — ABNORMAL LOW (ref 83–108)

## 2021-12-23 LAB — GLUCOSE, CAPILLARY
Glucose-Capillary: 204 mg/dL — ABNORMAL HIGH (ref 70–99)
Glucose-Capillary: 211 mg/dL — ABNORMAL HIGH (ref 70–99)
Glucose-Capillary: 219 mg/dL — ABNORMAL HIGH (ref 70–99)
Glucose-Capillary: 221 mg/dL — ABNORMAL HIGH (ref 70–99)
Glucose-Capillary: 230 mg/dL — ABNORMAL HIGH (ref 70–99)
Glucose-Capillary: 246 mg/dL — ABNORMAL HIGH (ref 70–99)
Glucose-Capillary: 250 mg/dL — ABNORMAL HIGH (ref 70–99)
Glucose-Capillary: 251 mg/dL — ABNORMAL HIGH (ref 70–99)
Glucose-Capillary: 252 mg/dL — ABNORMAL HIGH (ref 70–99)
Glucose-Capillary: 270 mg/dL — ABNORMAL HIGH (ref 70–99)
Glucose-Capillary: 271 mg/dL — ABNORMAL HIGH (ref 70–99)
Glucose-Capillary: 282 mg/dL — ABNORMAL HIGH (ref 70–99)
Glucose-Capillary: 284 mg/dL — ABNORMAL HIGH (ref 70–99)
Glucose-Capillary: 287 mg/dL — ABNORMAL HIGH (ref 70–99)
Glucose-Capillary: 313 mg/dL — ABNORMAL HIGH (ref 70–99)
Glucose-Capillary: 340 mg/dL — ABNORMAL HIGH (ref 70–99)

## 2021-12-23 LAB — PROTIME-INR
INR: 1.7 — ABNORMAL HIGH (ref 0.8–1.2)
Prothrombin Time: 19.5 seconds — ABNORMAL HIGH (ref 11.4–15.2)

## 2021-12-23 LAB — RENAL FUNCTION PANEL
Albumin: 2.2 g/dL — ABNORMAL LOW (ref 3.5–5.0)
Anion gap: 4 — ABNORMAL LOW (ref 5–15)
BUN: 18 mg/dL (ref 6–20)
CO2: 23 mmol/L (ref 22–32)
Calcium: 5.7 mg/dL — CL (ref 8.9–10.3)
Chloride: 109 mmol/L (ref 98–111)
Creatinine, Ser: 0.84 mg/dL (ref 0.44–1.00)
GFR, Estimated: >60 mL/min (ref >60)
Glucose, Bld: 218 mg/dL — ABNORMAL HIGH (ref 70–99)
Phosphorus: 2.4 mg/dL — ABNORMAL LOW (ref 2.5–4.6)
Potassium: 2.2 mmol/L — CL (ref 3.5–5.1)
Sodium: 136 mmol/L (ref 135–145)

## 2021-12-23 LAB — HIV ANTIBODY (ROUTINE TESTING W REFLEX): HIV Screen 4th Generation wRfx: NONREACTIVE

## 2021-12-23 LAB — ACETAMINOPHEN LEVEL: Acetaminophen (Tylenol), Serum: <10 ug/mL — ABNORMAL LOW (ref 10–30)

## 2021-12-23 LAB — ETHANOL: Alcohol, Ethyl (B): 41 mg/dL — ABNORMAL HIGH (ref <10)

## 2021-12-23 LAB — POTASSIUM
Potassium: 3.6 mmol/L (ref 3.5–5.1)
Potassium: 3.1 mmol/L — ABNORMAL LOW (ref 3.5–5.1)
Potassium: 2.8 mmol/L — ABNORMAL LOW (ref 3.5–5.1)
Potassium: 3.2 mmol/L — ABNORMAL LOW (ref 3.5–5.1)

## 2021-12-23 LAB — SARS CORONAVIRUS 2 BY RT PCR: SARS Coronavirus 2 by RT PCR: NEGATIVE

## 2021-12-23 LAB — PHOSPHORUS: Phosphorus: 1.8 mg/dL — ABNORMAL LOW (ref 2.5–4.6)

## 2021-12-23 LAB — APTT: aPTT: 35 seconds (ref 24–36)

## 2021-12-23 LAB — MAGNESIUM: Magnesium: 2.7 mg/dL — ABNORMAL HIGH (ref 1.7–2.4)

## 2021-12-23 LAB — TRIGLYCERIDES: Triglycerides: 205 mg/dL — ABNORMAL HIGH (ref <150)

## 2021-12-23 LAB — CBG MONITORING, ED: Glucose-Capillary: 288 mg/dL — ABNORMAL HIGH (ref 70–99)

## 2021-12-23 LAB — SALICYLATE LEVEL: Salicylate Lvl: <7 mg/dL — ABNORMAL LOW (ref 7.0–30.0)

## 2021-12-23 MED ORDER — GLUCAGON HCL RDNA (DIAGNOSTIC) 1 MG IJ SOLR: 5.0000 mg | Freq: Once | INTRAMUSCULAR | Status: DC

## 2021-12-23 MED ORDER — NOREPINEPHRINE 16 MG/250ML-% IV SOLN
0.0000 ug/min | INTRAVENOUS | Status: DC
  Administered 2021-12-23: 25 ug/min via INTRAVENOUS
  Administered 2021-12-24: 2 ug/min via INTRAVENOUS
  Filled 2021-12-23 (×3): qty 250

## 2021-12-23 MED ORDER — INSULIN HIGH DOSE BOLUS VIA INFUSION (FOR BETA BLOCKER / CALCIUM CHANNEL BLOCKER OVERDOSE)
1.0000 [IU]/kg | Freq: Once | INTRAVENOUS | Status: AC
  Administered 2021-12-23: 77.1 [IU] via INTRAVENOUS
  Filled 2021-12-23: qty 78

## 2021-12-23 MED ORDER — HEPARIN SODIUM (PORCINE) 5000 UNIT/ML IJ SOLN
5000.0000 [IU] | Freq: Three times a day (TID) | INTRAMUSCULAR | Status: DC
  Administered 2021-12-23 – 2021-12-24 (×2): 5000 [IU] via SUBCUTANEOUS
  Filled 2021-12-23 (×2): qty 1

## 2021-12-23 MED ORDER — CHARCOAL ACTIVATED PO LIQD
50.0000 g | Freq: Once | ORAL | Status: DC
  Filled 2021-12-23: qty 240

## 2021-12-23 MED ORDER — EPINEPHRINE HCL 5 MG/250ML IV SOLN IN NS
0.5000 ug/min | INTRAVENOUS | Status: DC
  Administered 2021-12-23: 6 ug/min via INTRAVENOUS
  Administered 2021-12-23 – 2021-12-24 (×2): 5 ug/min via INTRAVENOUS
  Filled 2021-12-23 (×3): qty 250

## 2021-12-23 MED ORDER — DOPAMINE-DEXTROSE 3.2-5 MG/ML-% IV SOLN
0.0000 ug/kg/min | INTRAVENOUS | Status: DC
  Administered 2021-12-23: 10 ug/kg/min via INTRAVENOUS
  Filled 2021-12-23 (×3): qty 250

## 2021-12-23 MED ORDER — EPINEPHRINE 1 MG/10ML IJ SOSY
PREFILLED_SYRINGE | INTRAMUSCULAR | Status: AC | PRN
  Administered 2021-12-23 (×5): 1 mg via INTRAVENOUS

## 2021-12-23 MED ORDER — SODIUM CHLORIDE 0.9 % IV SOLN
3500.0000 mg | Freq: Once | INTRAVENOUS | Status: AC
  Administered 2021-12-23: 3500 mg via INTRAVENOUS
  Filled 2021-12-23: qty 35

## 2021-12-23 MED ORDER — ORAL CARE MOUTH RINSE: 15.0000 mL | OROMUCOSAL | Status: DC | PRN

## 2021-12-23 MED ORDER — STERILE WATER FOR INJECTION IV SOLN
INTRAVENOUS | Status: DC
  Filled 2021-12-23 (×3): qty 143

## 2021-12-23 MED ORDER — MIDAZOLAM-SODIUM CHLORIDE 100-0.9 MG/100ML-% IV SOLN
0.5000 mg/h | INTRAVENOUS | Status: DC
  Administered 2021-12-23: 0.5 mg/h via INTRAVENOUS
  Administered 2021-12-24: 6 mg/h via INTRAVENOUS
  Filled 2021-12-23 (×3): qty 100

## 2021-12-23 MED ORDER — SODIUM BICARBONATE 8.4 % IV SOLN
INTRAVENOUS | Status: AC
  Filled 2021-12-23: qty 100

## 2021-12-23 MED ORDER — EPINEPHRINE 1 MG/10ML IJ SOSY
PREFILLED_SYRINGE | INTRAMUSCULAR | Status: AC | PRN
  Administered 2021-12-23 (×2): 1 mg via INTRAVENOUS

## 2021-12-23 MED ORDER — POTASSIUM CHLORIDE 10 MEQ/50ML IV SOLN
10.0000 meq | INTRAVENOUS | Status: AC
  Administered 2021-12-23 – 2021-12-24 (×6): 10 meq via INTRAVENOUS
  Filled 2021-12-23 (×6): qty 50

## 2021-12-23 MED ORDER — SODIUM CHLORIDE 0.9 % IV SOLN: 250.0000 mL | INTRAVENOUS | Status: DC

## 2021-12-23 MED ORDER — LEVETIRACETAM IN NACL 1000 MG/100ML IV SOLN
1000.0000 mg | Freq: Two times a day (BID) | INTRAVENOUS | Status: DC
  Administered 2021-12-24: 1000 mg via INTRAVENOUS
  Filled 2021-12-23 (×2): qty 100

## 2021-12-23 MED ORDER — THIAMINE HCL 100 MG/ML IJ SOLN: 100.0000 mg | INTRAMUSCULAR | Status: DC

## 2021-12-23 MED ORDER — NOREPINEPHRINE 4 MG/250ML-% IV SOLN
2.0000 ug/min | INTRAVENOUS | Status: DC
  Administered 2021-12-23: 40 ug/min via INTRAVENOUS
  Administered 2021-12-23: 30 ug/min via INTRAVENOUS
  Filled 2021-12-23 (×2): qty 250

## 2021-12-23 MED ORDER — SODIUM BICARBONATE 8.4 % IV SOLN
INTRAVENOUS | Status: AC | PRN
  Administered 2021-12-23 (×2): 50 meq via INTRAVENOUS

## 2021-12-23 MED ORDER — DEXTROSE 50 % IV SOLN: 0.5000 g/kg | Freq: Once | INTRAVENOUS | Status: DC | PRN

## 2021-12-23 MED ORDER — LORAZEPAM 2 MG/ML IJ SOLN
2.0000 mg | Freq: Once | INTRAMUSCULAR | Status: AC
  Administered 2021-12-23: 2 mg via INTRAVENOUS
  Filled 2021-12-23: qty 1

## 2021-12-23 MED ORDER — CALCIUM CHLORIDE 10 % IV SOLN
INTRAVENOUS | Status: AC | PRN
  Administered 2021-12-23: 1 g via INTRAVENOUS

## 2021-12-23 MED ORDER — FAT EMULSION PLANT BASED 20% (INTRALIPID)IV EMUL
250.0000 mL | INTRAVENOUS | Status: AC
  Administered 2021-12-23: 250 mL via INTRAVENOUS
  Filled 2021-12-23 (×49): qty 250

## 2021-12-23 MED ORDER — ATROPINE SULFATE 1 MG/ML IV SOLN
INTRAVENOUS | Status: AC | PRN
  Administered 2021-12-23 (×2): 1 mg via INTRAVENOUS

## 2021-12-23 MED ORDER — LEVETIRACETAM IN NACL 1000 MG/100ML IV SOLN
1000.0000 mg | Freq: Once | INTRAVENOUS | Status: AC
  Administered 2021-12-23: 1000 mg via INTRAVENOUS
  Filled 2021-12-23: qty 100

## 2021-12-23 MED ORDER — PANTOPRAZOLE SODIUM 40 MG IV SOLR
40.0000 mg | Freq: Two times a day (BID) | INTRAVENOUS | Status: DC
  Administered 2021-12-23 – 2021-12-24 (×2): 40 mg via INTRAVENOUS
  Filled 2021-12-23 (×2): qty 10

## 2021-12-23 MED ORDER — NOREPINEPHRINE 4 MG/250ML-% IV SOLN: 0.0000 ug/min | INTRAVENOUS | Status: DC

## 2021-12-23 MED ORDER — PANTOPRAZOLE 2 MG/ML SUSPENSION
40.0000 mg | Freq: Every day | ORAL | Status: DC
  Filled 2021-12-23: qty 20

## 2021-12-23 MED ORDER — GLUCAGON HCL (RDNA) 1 MG IJ SOLR
INTRAMUSCULAR | Status: AC | PRN
  Administered 2021-12-23: 2 mg via INTRAVENOUS

## 2021-12-23 MED ORDER — SODIUM CHLORIDE 0.9 % IV SOLN
0.0000 [IU]/kg/h | INTRAVENOUS | Status: DC
  Administered 2021-12-23: 2 [IU]/kg/h via INTRAVENOUS
  Administered 2021-12-23: 3 [IU]/kg/h via INTRAVENOUS
  Administered 2021-12-23: 1 [IU]/kg/h via INTRAVENOUS
  Administered 2021-12-24 (×3): 3 [IU]/kg/h via INTRAVENOUS
  Filled 2021-12-23 (×6): qty 10

## 2021-12-23 MED ORDER — THIAMINE HCL 100 MG/ML IJ SOLN
500.0000 mg | Freq: Three times a day (TID) | INTRAVENOUS | Status: DC
  Administered 2021-12-23 (×2): 500 mg via INTRAVENOUS
  Filled 2021-12-23 (×4): qty 5

## 2021-12-23 MED ORDER — GLUCAGON HCL (RDNA) 1 MG IJ SOLR
INTRAMUSCULAR | Status: AC | PRN
  Administered 2021-12-23: 3 mg via INTRAVENOUS

## 2021-12-23 MED ORDER — SODIUM BICARBONATE 8.4 % IV SOLN
150.0000 meq | Freq: Once | INTRAVENOUS | Status: AC
  Administered 2021-12-23: 150 meq via INTRAVENOUS
  Filled 2021-12-23: qty 50

## 2021-12-23 MED ORDER — ORAL CARE MOUTH RINSE
15.0000 mL | OROMUCOSAL | Status: DC
  Administered 2021-12-23 – 2021-12-24 (×8): 15 mL via OROMUCOSAL

## 2021-12-23 MED ORDER — EPINEPHRINE 1 MG/10ML IJ SOSY
PREFILLED_SYRINGE | INTRAMUSCULAR | Status: AC
  Filled 2021-12-23: qty 40

## 2021-12-23 MED ORDER — DEXTROSE 10 % IV SOLN: INTRAVENOUS | Status: DC

## 2021-12-23 MED ORDER — GADOBUTROL 1 MMOL/ML IV SOLN
7.0000 mL | Freq: Once | INTRAVENOUS | Status: AC | PRN
  Administered 2021-12-23: 7 mL via INTRAVENOUS

## 2021-12-23 MED ORDER — DEXTROSE 10 % IV SOLN: 5.0000 mL/kg/h | INTRAVENOUS | Status: DC

## 2021-12-23 MED ORDER — DEXTROSE 50 % IV SOLN
1.0000 | INTRAVENOUS | Status: DC | PRN
  Administered 2021-12-24 (×2): 50 mL via INTRAVENOUS
  Filled 2021-12-23 (×2): qty 50

## 2021-12-23 NOTE — Progress Notes (Signed)
Lactic 6.7. no changes per MD Kasa at this time. Continue current plan of care.

## 2021-12-23 NOTE — Code Documentation (Signed)
Dopamine started at 10 mg/hr.

## 2021-12-23 NOTE — Progress Notes (Signed)
This RN contacted by [employer] All questions answered at this time.

## 2021-12-23 NOTE — Code Documentation (Signed)
D10 infusion initiated at 385 ml/h

## 2021-12-23 NOTE — Procedures (Signed)
Central Venous Catheter Insertion Procedure Note  EMMACLAIRE SWITALA  [id number]  11/16/1970  Date:12/25/2021  Time:3:49 PM   Provider Performing:Weslynn Ke Micheline Chapman   Procedure: Insertion of Non-tunneled Central Venous [phone]) with US guidance ([id number])   Indication(s) Medication administration  Consent Risks of the procedure as well as the alternatives and risks of each were explained to the patient and/or caregiver.  Consent for the procedure was obtained and is signed in the bedside chart  Anesthesia Topical only with 1% lidocaine   Timeout Verified patient identification, verified procedure, site/side was marked, verified correct patient position, special equipment/implants available, medications/allergies/relevant history reviewed, required imaging and test results available.  Sterile Technique Maximal sterile technique including full sterile barrier drape, hand hygiene, sterile gown, sterile gloves, mask, hair covering, sterile ultrasound probe cover (if used).  Procedure Description Area of catheter insertion was cleaned with chlorhexidine and draped in sterile fashion.  With real-time ultrasound guidance a central venous catheter was placed into the left internal jugular vein. Nonpulsatile blood flow and easy flushing noted in all ports.  The catheter was sutured in place and sterile dressing applied.  Complications/Tolerance None; patient tolerated the procedure well. Chest X-ray is ordered to verify placement for internal jugular or subclavian cannulation.   Chest x-ray is not ordered for femoral cannulation.  EBL Minimal  Specimen(s) None  Donell Beers, AGNP  Pulmonary/Critical Care Pager [phone] (please enter 7 digits) PCCM Consult Pager [phone] (please enter 7 digits)

## 2021-12-23 NOTE — Code Documentation (Signed)
Introlipids started. Levo started at 50mg

## 2021-12-23 NOTE — Progress Notes (Signed)
Transport to [facility] without incident.

## 2021-12-23 NOTE — Procedures (Addendum)
Patient Name: Latoya [name]  MRN: [id number]  Epilepsy Attending: Lora Havens  Referring Physician/Provider: Teressa Lower, NP  Date: 12/15/2021 Duration: 26.10 mins  Patient history: 51yoF s/p cardiac arrest. EEG to evaluate for seizure.  Level of alertness: comatose  AEDs during EEG study: None  Technical aspects: This EEG study was done with scalp electrodes positioned according to the 10-20 International system of electrode placement. Electrical activity was reviewed with band pass filter of 1-'70Hz'$ , sensitivity of 7 uV/mm, display speed of 33m/sec with a '60Hz'$  notched filter applied as appropriate. EEG data were recorded continuously and digitally stored.  Video monitoring was available and reviewed as appropriate.  Description: Patient was noted to have episodes of brief sudden eye opening every 30-40 seconds .  Concomitant EEG showed generalized polyspikes consistent with myoclonic seizures lasting 2.5-3 seconds.  In between seizures EEG showed generalized background suppression. Hyperventilation and photic stimulation were not performed.     ABNORMALITY - Myoclonic seizure, generalized - Background suppression, generalized  IMPRESSION: Patient was noted to have myoclonic seizures lasting 2.5-3 seconds every 30-40 seconds.  Additionally there was evidence of profound diffuse encephalopathy.  In the setting of cardiac arrest, this EEG pattern is suggestive of anoxic/hypoxic brain injury.  Dr. KMortimer Frieswas notified.  Leyanna Bittman OBarbra Sarks

## 2021-12-23 NOTE — Consult Note (Signed)
[place] for High-dose insulin therapy Indication: BB overdose   Patient Measurements: Height: '5\' 7"'$  (170.2 cm) Weight: 77.1 kg (170 lb) IBW/kg (Calculated) : 61.6  Vital Signs: Temp: 96.1 F (35.6 C) (09/21 1415) Temp Source: Esophageal (09/21 1106) BP: 111/73 (09/21 1415) Pulse Rate: 112 (09/21 1415) Intake/Output from previous day: No intake/output data recorded. Intake/Output from this shift: No intake/output data recorded.  Labs: Recent Labs    12/03/2021 0800  WBC 9.2  HGB 12.3  HCT 38.4  PLT 219  CREATININE 1.29*  MG 2.7*  ALBUMIN 2.8*  PROT 5.2*  AST 376*  ALT 317*  ALKPHOS 53  BILITOT 0.9    Estimated Creatinine Clearance: 54.6 mL/min (A) (by C-G formula based on SCr of 1.29 mg/dL (H)).  Medical History: Past Medical History:  Diagnosis Date   Anxiety    Condyloma acuminata    Depression    Hematuria    PE (pulmonary embolism)     Medications:  D10% infusion at 385 mL/hr (order range 300 - 600 mL/hr) Insulin infusion at 3 units/kg/hr (order range 0-10 units/kg/hr)  Assessment: 51 y.o. female who comes in with cardiac arrest with concern for beta-blocker (extended release), indomethacin, and possible amiodarone overdose. Team in contact with poison control.  Goal of Therapy:  BG 150-250 mg/dL Electrolytes (K+) within normal limits   Plan:  --Continue to titrate insulin infusion as able while maintaining blood glucose range as above Monitor volume status / I&Os closely May need to consider transitioning to D50w infusion when / if central line place to conserve volume --K+ checks q4h K 6.2 >> 3.6 Will replace cautiously given renal dysfunction --CBG every 30 minutes until BG stable, then every 2 hours --Repeat Mag and Phos with AM labs --Monitor TG if lipid therapy continued (baseline TG 205)  Benita Gutter 12/17/2021,2:54 PM

## 2021-12-23 NOTE — Progress Notes (Signed)
This RN, CN Megan, and RT Tammy transported patient down to CT without incident. Patient returned to room with vitals stable. Family in waiting room updated by this RN and returned to bedside.   Patient continues to remain unresponsive. Per verbal order from NP Meda Coffee, Versed to be administered at 6 mg/hr and titrated levo down accordingly as BP stabilized. Safety set in place on central line. Family met with NP, MD and Neurology to review patient's tests results and plan of care. Family expresses understanding of patient's plan of care at this time.   Bedside shift report given by this RN to [employer]. All medications as well as today's events reviewed.

## 2021-12-23 NOTE — Consult Note (Signed)
Neurology Consultation Reason for Consult: Anoxic brain injury Requesting Physician: Flora Lipps  CC: Overdose (indomethacin, propanolol, amiodarone)  History is obtained from: Husband at bedside and chart review  HPI: Latoya [name] is a 51 y.o. female with past medical history significant for anxiety and depression, daily alcohol use, vaping, PE on Xarelto, hyperlipidemia, and primary headache associated with sexual activity  She was found with bizarre behavior (thrashing around, vomiting) with multiple pill bottles empty around her.  She was coded initially for 30 minutes by EMS and coded again in the ER for at least 15 minutes.  Now requiring 3 pressors to maintain blood pressure.  Not stable enough for head CT scan, extremely acidotic on bicarb drip.  EEG with myoclonic status for which neurology was consulted  She was last seen by neurology 09/28/2020 for an episode of unresponsiveness in setting of headache with sexual activity felt to be primary headache associated with sexual activity for which she was started on propanolol 80 mg ER daily as well as indomethacin 50 mg to be taken 1 to 2 hours before coitus or as needed  ROS: Unable to obtain due to altered mental status.   Past Medical History:  Diagnosis Date   Anxiety    Condyloma acuminata    Depression    Hematuria    PE (pulmonary embolism)    Past Surgical History:  Procedure Laterality Date   ABDOMINAL HYSTERECTOMY     ABLATION ON ENDOMETRIOSIS  04/04/2005   COLONOSCOPY WITH PROPOFOL N/A 08/09/2016   Procedure: COLONOSCOPY WITH PROPOFOL;  Surgeon: Jonathon Bellows, MD;  Location: [facility] ENDOSCOPY;  Service: Endoscopy;  Laterality: N/A;   RIGHT OOPHORECTOMY     SALPINGECTOMY Left    TOTAL ABDOMINAL HYSTERECTOMY  2016   Surgical Pathology completed in 2016   TUBAL LIGATION  04/04/1988   Current Outpatient Medications  Medication Instructions   lisdexamfetamine (VYVANSE) 50 mg, Oral, Daily   ofloxacin (FLOXIN OTIC) 0.3  % OTIC solution 5 drops, Right EAR, 2 times daily   propranolol ER (INDERAL LA) 80 mg, Oral, Daily   rivaroxaban (XARELTO) 20 mg, Oral, Daily   rosuvastatin (CRESTOR) 20 mg, Oral, Daily   Venlafaxine HCl 225 mg, Oral, Daily   Family History  Problem Relation Age of Onset   Pulmonary embolism Mother    Lymphoma Father    Multiple sclerosis Sister    Lymphoma Brother     Social History:  reports that she has been smoking e-cigarettes. She has never used smokeless tobacco. She reports current alcohol use of about 21.0 standard drinks of alcohol per week. She reports that she does not use drugs.  Exam: Current vital signs: BP (!) 76/46   Pulse 65   Temp (!) 95.4 F (35.2 C) (Esophageal)   Resp (!) 24   Ht '5\' 7"'$  (1.702 m)   Wt 77.1 kg   SpO2 (!) 62%   BMI 26.63 kg/m  Vital signs in last 24 hours: Temp:  [95.4 F (35.2 C)] 95.4 F (35.2 C) (09/21 1106) Pulse Rate:  [25-117] 65 (09/21 1106) Resp:  [12-102] 24 (09/21 1106) BP: (49-144)/(24-107) 76/46 (09/21 0936) SpO2:  [50 %-99 %] 62 % (09/21 1106) FiO2 (%):  [100 %] 100 % (09/21 1122) Weight:  [77.1 kg] 77.1 kg (09/21 0757)   Physical Exam  Constitutional: Appears well-developed and well-nourished.  Psych: Not interactive Eyes: Scleral edema is absent   HENT: ET tube in place MSK: no joint deformities.  Cardiovascular: Normal rate and regular  rhythm.  Respiratory: Breathing over the ventilator set rate GI: Soft.  No distension. Skin: Warm dry and intact visible skin.    Neuro: On Versed 2 mg/h Mental Status: Intermittent spontaneous eye opening consistent with myoclonic seizure activity occasional facial twitching Does not follow any commands Cranial Nerves: II: No blink to threat. Pupils are bilaterally dilated, with the right slightly responsive at 5.55 mm, left nonresponsive III,IV, VI/VIII: EOMI absent to VOR V/VII: Facial sensation is symmetric to corneal stimulation VIII: No response to voice X/XI: Intact  cough XII: Unable to assess tongue protrusion secondary to patient's mental status  Motor/Sensory: Tone is normal. Bulk is normal.  No movement to noxious stim in any of the extremities  Plantars: Toes are mute bilaterally.  Cerebellar: Unable to assess secondary to patient's mental status    I have reviewed labs in epic and the results pertinent to this consultation are:  Basic Metabolic Panel: Recent Labs  Lab 12/20/2021 0800  NA 139  K 6.2*  CL 105  CO2 18*  GLUCOSE 363*  BUN 18  CREATININE 1.29*  CALCIUM 8.4*  MG 2.7*   Lab Results  Component Value Date   ALT 317 (H) 12/28/2021   AST 376 (H) 12/21/2021   ALKPHOS 53 12/28/2021   BILITOT 0.9 12/20/2021   CBC: Recent Labs  Lab 12/29/2021 0800  WBC 9.2  NEUTROABS 4.0  HGB 12.3  HCT 38.4  MCV 105.8*  PLT 219    Ethanol level 41  Coagulation Studies: No results for input(s): "LABPROT", "INR" in the last 72 hours.    Latest Reference Range & Units Most Recent  FIO2 % 100 12/19/2021 10:44  Mode  PRESSURE REGULATED VOLUME CONTROL 12/28/2021 10:44  pH, Arterial 7.35 - 7.45  7.07 (LL) 12/22/2021 10:44  pCO2 arterial 32 - 48 mmHg 79 (HH) 12/15/2021 10:44  pO2, Arterial 83 - 108 mmHg 75 (L) 12/06/2021 10:44  Acid-base deficit 0.0 - 2.0 mmol/L 8.8 (H) 12/11/2021 10:44  Bicarbonate 20.0 - 28.0 mmol/L 22.9 12/07/2021 10:44  O2 Saturation % 92.5 12/06/2021 10:44  Patient temperature  37.0 12/25/2021 10:44  Collection site  RIGHT RADIAL 12/17/2021 10:44  Allens test (pass/fail) PASS  PASS 12/31/2021 10:44  (LL): Data is critically low [facility]): Data is critically high (L): Data is abnormally low (H): Data is abnormally high   I have reviewed the images obtained: No head CT obtained due to patient's clinical stability   Impression: Concern for seizures secondary to anoxic brain injury  Recommendations:  # Myoclonic seizures - Keppra 4500 mg load, then 1000 mg BID (max for current renal function) - Ceribell EEG to monitor for seizure  control - Head CT when clinically stable - Check PT/INR, treat as needed - Discussed with CCM team, will try Versed if needed for seizure control given her blood pressure  -Titrate to flat ceribell EEG and control of myoclonic movements as tolerated by blood pressure  #Daily drinking - Thiamine 500 mg 3 times daily for 3 days followed by 100 mg daily - Additional management per CCM  Lesleigh Noe MD-PhD [facility] [phone] [facility] coverage for [facility] is from 8 AM to 4 AM in-house and 4 PM to 8 PM by telephone/video. 8 PM to 8 AM emergent questions or overnight urgent questions should be addressed to Teleneurology On-call or [facility] neurohospitalist; contact information can be found on AMION   Total critical care time: 50 minutes   Critical care time was exclusive of separately billable procedures and  treating other patients.   Critical care was necessary to treat or prevent imminent or life-threatening deterioration.   Critical care was time spent personally by me on the following activities: development of treatment plan with patient and/or surrogate as well as nursing, discussions with consultants/primary team, evaluation of patient's response to treatment, examination of patient, obtaining history from patient or surrogate, ordering and performing treatments and interventions, ordering and review of laboratory studies, ordering and review of radiographic studies, and re-evaluation of patient's condition as needed, as documented above.

## 2021-12-23 NOTE — Code Documentation (Signed)
Levo at 40 mcg/min

## 2021-12-23 NOTE — Progress Notes (Signed)
Spiritual care supported continued to be provided to family. Chaplain aided husband in getting to ED for assessment due to a medical event. Husband desires to stay in room with "Collie Siad" tonight. Patient has a son out of the country but all other family members have visited. Chaplain offered husband words of assurance.     12/08/2021 2100  Clinical Encounter Type  Visited With Patient and family together  Visit Type Follow-up  Referral From Chaplain  Consult/Referral To Chaplain  Spiritual Encounters  Spiritual Needs Emotional;Grief support

## 2021-12-23 NOTE — Progress Notes (Signed)
Eeg done 

## 2021-12-23 NOTE — Code Documentation (Signed)
Levo at 10 mcg

## 2021-12-23 NOTE — Progress Notes (Signed)
Ceribell applied per order from MD Bhagat. Versed initiated at 0.5, Keppra administered.  Waiting KUB results prior to further medication administration per tube.

## 2021-12-23 NOTE — Code Documentation (Signed)
Levo at 20 mcg
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
## 2021-12-23 NOTE — Consult Note (Addendum)
Benjamin Perez for High-dose insulin therapy Indication: BB overdose   Patient Measurements: Height: '5\' 7"'$  (170.2 cm) Weight: 77.1 kg (170 lb) IBW/kg (Calculated) : 61.6  Vital Signs: Temp: 96.4 F (35.8 C) (09/21 1600) Temp Source: Esophageal (09/21 1106) BP: 131/95 (09/21 1600) Pulse Rate: 52 (09/21 1600) Intake/Output from previous day: No intake/output data recorded. Intake/Output from this shift: Total I/O In: 3127.8 [I.V.:2838.2; IV Piggyback:289.6] Out: -   Labs: Recent Labs    12/09/2021 0800 12/25/2021 1545  WBC 9.2  --   HGB 12.3  --   HCT 38.4  --   PLT 219  --   APTT  --  35  CREATININE 1.29* 0.84  MG 2.7*  --   PHOS  --  2.4*  ALBUMIN 2.8* 2.2*  PROT 5.2*  --   AST 376*  --   ALT 317*  --   ALKPHOS 53  --   BILITOT 0.9  --     Estimated Creatinine Clearance: 83.9 mL/min (by C-G formula based on SCr of 0.84 mg/dL).  Medical History: Past Medical History:  Diagnosis Date   Anxiety    Condyloma acuminata    Depression    Hematuria    PE (pulmonary embolism)     Medications:  D10% infusion at 385 mL/hr (order range 300 - 600 mL/hr) Insulin infusion at 3 units/kg/hr (order range 0-10 units/kg/hr)  Assessment: 51 y.o. female who comes in with cardiac arrest with concern for beta-blocker (extended release), indomethacin, and possible amiodarone overdose. Team in contact with poison control.  Goal of Therapy:  BG 150-250 mg/dL Electrolytes (K+) within normal limits   Plan:  --Continue to titrate insulin infusion as able while maintaining blood glucose range as above Monitor volume status / I&Os closely Plan on scheduling 1 amp of D50w every hour (25 g dextrose) and go ahead and cut D10w infusion in half (38 >> 19 g dextrose / hr) and see if there's room to continue to drop that rate and conserve volume --K+ checks q4h K 2.6>2.8>3.2: KCL IV 18mq q1h x6 doses ordered per CCM team. Scr 1.29>0.84 (aggressively  rehydrated via MIVF above) - NOTE-- Pt anuric currently. Watch and replete cautiously. Na 139>136 (watch D10w gtt &  D50W boluses above) --CBG every 30 minutes until BG stable, then every 2 hours --Repeat Mag and Phos with AM labs --Monitor TG if lipid therapy continued (baseline TG 205)  BShanon BrowBeers 01/01/2022,6:49 PM

## 2021-12-23 NOTE — IPAL (Signed)
  Interdisciplinary Goals of Care Family Meeting   Date carried out: 12/22/2021  Location of the meeting: Bedside  Member's involved: Physician, Nurse Practitioner, Bedside Registered Nurse, and Family Member or next of kin        GOALS OF CARE DISCUSSION  The Clinical status was relayed to family in detail-Husband and Step daughter  Updated and notified of patients medical condition- Patient remains unresponsive and will not open eyes to command.   Patient is having a weak cough and struggling to remove secretions.   Showing signs of brain damage Explained to family course of therapy and the modalities   Patient with Progressive multiorgan failure with a very high probablity of a very minimal chance of meaningful recovery despite all aggressive and optimal medical therapy.   PATIENT REMAINS FULL CODE  Family understands the situation. Admit to ICU, continue pressors Prognosis is grave  Family are satisfied with Plan of action and management. All questions answered  Additional CC time 35 mins   Londyn Wotton Patricia Pesa, M.D.  [facility]  Medical Director [place] Director [facility] Cardio-Pulmonary Department

## 2021-12-23 NOTE — Progress Notes (Signed)
   12/15/2021 0900  Clinical Encounter Type  Visited With Family  Visit Type Initial;Code;ED  Referral From Nurse  Consult/Referral To Chaplain  Spiritual Encounters  Spiritual Needs Emotional;Grief support   Chaplain responded to call to provide support to family of patient who coded. Chaplain accompanied husband who is in shock and grieving hard over situation. Chaplain accompanied family to ICU waiting while they wait for patient to be transferred. Chaplain will continue to monitor the situation.

## 2021-12-23 NOTE — Code Documentation (Signed)
MD, RT, RN, and EMT at [facility]

## 2021-12-23 NOTE — Code Documentation (Signed)
Pace failed to capture. Compressions resumed

## 2021-12-23 NOTE — H&P (Addendum)
NAME:  Latoya [name], MRN:  [id number], DOB:  1970-04-18, LOS: 0 ADMISSION DATE:  12/14/2021  BRIEF SYNOPSIS ACUTE DRUG OD AND SUICIDE ATTEMPT RESULING IN CARDIAC ARREST  History of Present Illness:  51 y.o. female who comes in with cardiac arrest.   Patient was noted to be acting very bizarre at home thrashing around there was concern  overdose.   When she got in the EMS truck she lost pulses.   They coded patient for 30 minutes and gave 1 mg of glucagon  +beta-blocker overdose although she might of taken some of her husbands amiodarone as well.    Patient came in unresponsive pupils dilated and nonreactive but unclear if there was any other medications.  Patient subsequently had LUCAS device in place and had an additional cardiac arrest in ER for 15 mins  NEUROLOGICAL EXAMINATION IS C?[place]  Patient supposedly has taken indomethacin, extended release Beta blocker toxicity and poisoning   Pertinent  Medical History   Active Ambulatory Problems    Diagnosis Date Noted   Anxiety 05/26/2015   Clinical depression 05/26/2015   Pulmonary embolism ([place]) 05/26/2015   Need for lipid screening 09/01/2020   Annual physical exam 09/01/2020   Attention deficit hyperactivity disorder (ADHD), combined type 12/15/2020   Acute hemorrhagic otitis externa of right ear 09/15/2021   Dyslipidemia 09/22/2021   Resolved Ambulatory Problems    Diagnosis Date Noted   No Resolved Ambulatory Problems   Past Medical History:  Diagnosis Date   Condyloma acuminata    Depression    Hematuria    PE (pulmonary embolism)      Significant Hospital Events: Including procedures, antibiotic start and stop dates in addition to other pertinent events   9/21 acute cardiac arrest and resp failure, beta blocker poisoning      Objective   Blood pressure (!) 72/45, pulse 93, resp. rate 19, weight 77.1 kg, SpO2 94 %.    Vent Mode: AC FiO2 (%):  [100 %] 100 % Set Rate:  [16 bmp] 16  bmp Vt Set:  [500 mL] 500 mL PEEP:  [10 cmH20] 10 cmH20  No intake or output data in the 24 hours ending 12/14/2021 0932 Filed Weights   12/05/2021 0757  Weight: 77.1 kg      REVIEW OF SYSTEMS  PATIENT IS UNABLE TO PROVIDE COMPLETE REVIEW OF SYSTEMS DUE TO SEVERE CRITICAL ILLNESS AND  METABOLIC ENCEPHALOPATHY   PHYSICAL EXAMINATION:  GENERAL:critically ill appearing, +resp distress EYES: Pupils equal, round, NOT reactive to light.  No scleral icterus.  MOUTH: Moist mucosal membrane. INTUBATED NECK: Supple.  PULMONARY: +rhonchi, +wheezing CARDIOVASCULAR: S1 and S2.  No murmurs  GASTROINTESTINAL: Soft, nontender, -distended. Positive bowel sounds.  MUSCULOSKELETAL: No swelling, clubbing, or edema.  NEUROLOGIC: obtunded, no corneal relfex, no gag reflex, oculocephalic reflex absent SKIN:mottled    Labs/imaging that I havepersonally reviewed  (right click and "Reselect all SmartList Selections" daily)      ASSESSMENT AND PLAN SYNOPSIS  51 yo white female with acute beta blocker poisoning resulting in sudden cardiac death with severe hypoxic resp failure leading to CODE BLUE/ACLS protocol now with signs and symptoms of severe brain damage, with PROLONGED CARDIAC ARREST, PATIENT IS NOT AN IDEAL CANDIDATE FOR NORMOTHERMIA PROTOCOL   Severe ACUTE Hypoxic and Hypercapnic Respiratory Failure -continue Mechanical Ventilator support -continue Bronchodilator Therapy -Wean Fio2 and PEEP as tolerated -VAP/VENT bundle implementation -will NOT perform SAT/SBT   Vent Mode: AC FiO2 (%):  [100 %] 100 %  Set Rate:  [16 bmp] 16 bmp Vt Set:  [500 mL] 500 mL PEEP:  [10 cmH20] 10 cmH20    CARDIAC FAILURE-beta blocker toxicity CARDIAC SHOCK Pressors as needed   CARDIAC ICU monitoring    NEUROLOGY Acute  metabolic encephalopathy Signs of brain damage Avoid sedation Consider CT/MRI/NEUROLOGY   ENDO - ICU hypoglycemic\Hyperglycemia protocol -check FSBS per  protocol   GI GI PROPHYLAXIS as indicated  NUTRITIONAL STATUS DIET-->NPO Constipation protocol as indicated   ELECTROLYTES -follow labs as needed -replace as needed -pharmacy consultation and following   ACUTE ANEMIA- TRANSFUSE AS NEEDED CONSIDER TRANSFUSION  IF HGB<7      Best practice (right click and "Reselect all SmartList Selections" daily)  Diet: NPO VAP protocol (if indicated): Yes DVT prophylaxis: Subcutaneous Heparin GI prophylaxis: PPI Mobility:  bed rest  Code Status:  FULL Disposition:ICU  Labs   CBC: Recent Labs  Lab 12/22/2021 0800  WBC 9.2  NEUTROABS 4.0  HGB 12.3  HCT 38.4  MCV 105.8*  PLT 878    Basic Metabolic Panel: Recent Labs  Lab 12/10/2021 0800  MG 2.7*   GFR: CrCl cannot be calculated (Patient's most recent lab result is older than the maximum 21 days allowed.). Recent Labs  Lab 12/15/2021 0800  WBC 9.2    Liver Function Tests: No results for input(s): "AST", "ALT", "ALKPHOS", "BILITOT", "PROT", "ALBUMIN" in the last 168 hours. No results for input(s): "LIPASE", "AMYLASE" in the last 168 hours. No results for input(s): "AMMONIA" in the last 168 hours.  ABG No results found for: "PHART", "PCO2ART", "PO2ART", "HCO3", "TCO2", "ACIDBASEDEF", "O2SAT"   Coagulation Profile: No results for input(s): "INR", "PROTIME" in the last 168 hours.  Cardiac Enzymes: No results for input(s): "CKTOTAL", "CKMB", "CKMBINDEX", "TROPONINI" in the last 168 hours.  HbA1C: No results found for: "HGBA1C"  CBG: Recent Labs  Lab 01/01/2022 0744  GLUCAP 288*     Past Medical History:  She,  has a past medical history of Anxiety, Condyloma acuminata, Depression, Hematuria, and PE (pulmonary embolism).   Surgical History:   Past Surgical History:  Procedure Laterality Date   ABDOMINAL HYSTERECTOMY     ABLATION ON ENDOMETRIOSIS  04/04/2005   COLONOSCOPY WITH PROPOFOL N/A 08/09/2016   Procedure: COLONOSCOPY WITH PROPOFOL;  Surgeon: Jonathon Bellows, MD;  Location[facility] ENDOSCOPY;  Service: Endoscopy;  Laterality: N/A;   RIGHT OOPHORECTOMY     SALPINGECTOMY Left    TOTAL ABDOMINAL HYSTERECTOMY  2016   Surgical Pathology completed in 2016   TUBAL LIGATION  04/04/1988     Social History:   reports that she has been smoking e-cigarettes. She has never used smokeless tobacco. She reports current alcohol use of about 21.0 standard drinks of alcohol per week. She reports that she does not use drugs.   Family History:  Her family history includes Lymphoma in her brother and father; Multiple sclerosis in her sister; Pulmonary embolism in her mother.   Allergies Allergies  Allergen Reactions   Biaxin [Clarithromycin] Nausea Only     Home Medications  Prior to Admission medications   Medication Sig Start Date End Date Taking? Authorizing Provider  lisdexamfetamine (VYVANSE) 50 MG capsule Take 1 capsule (50 mg total) by mouth daily. 12/23/15   Shambley, Melody N, CNM  ofloxacin (FLOXIN OTIC) 0.3 % OTIC solution Place 5 drops into the right ear 2 (two) times daily. 09/15/21   Cletis Athens, MD  propranolol ER (INDERAL LA) 80 MG 24 hr capsule Take 1 capsule (80 mg total)  by mouth daily. 05/24/21   Cletis Athens, MD  rivaroxaban (XARELTO) 20 MG TABS tablet Take 1 tablet (20 mg total) by mouth daily. 05/24/21   Cletis Athens, MD  rosuvastatin (CRESTOR) 20 MG tablet Take 1 tablet (20 mg total) by mouth daily. 09/22/21   Cletis Athens, MD  Venlafaxine HCl 225 MG TB24 Take 1 tablet by mouth once daily 12/20/21   Cletis Athens, MD       DVT/GI PRX  assessed I Assessed the need for Labs I Assessed the need for Foley I Assessed the need for Central Venous Line Family Discussion when available I Assessed the need for Mobilization I made an Assessment of medications to be adjusted accordingly Safety Risk assessment completed  CASE DISCUSSED IN MULTIDISCIPLINARY ROUNDS WITH ICU TEAM     Critical Care Time devoted to patient care services  described in this note is 75 minutes.   Critical care was necessary to treat /prevent imminent and life-threatening deterioration.   PATIENT WITH VERY POOR PROGNOSIS I ANTICIPATE PROLONGED ICU LOS  Patient is critically ill. Patient with Multiorgan failure and at high risk for cardiac arrest and death.    Corrin Parker, M.D.  [facility]  Medical Director [place] Director [facility] Cardio-Pulmonary Department

## 2021-12-23 NOTE — Consult Note (Addendum)
MEDICATION RELATED CONSULT NOTE - INITIAL   Pharmacy Consult for High-dose insulin therapy Indication: BB/CCB overdose   Allergies  Allergen Reactions   Biaxin [Clarithromycin] Nausea Only    Patient Measurements: Weight: 77.1 kg (170 lb)  Vital Signs: BP: 76/46 (09/21 0936) Pulse Rate: 92 (09/21 0937) Intake/Output from previous day: No intake/output data recorded. Intake/Output from this shift: No intake/output data recorded.  Labs: Recent Labs    12/09/2021 0800  WBC 9.2  HGB 12.3  HCT 38.4  PLT 219  CREATININE 1.29*  MG 2.7*  ALBUMIN 2.8*  PROT 5.2*  AST 376*  ALT 317*  ALKPHOS 53  BILITOT 0.9   Estimated Creatinine Clearance: 54.6 mL/min (A) (by C-G formula based on SCr of 1.29 mg/dL (H)).   Microbiology: No results found for this or any previous visit (from the past 720 hour(s)).  Medical History: Past Medical History:  Diagnosis Date   Anxiety    Condyloma acuminata    Depression    Hematuria    PE (pulmonary embolism)     Medications:  D10% infusion, titrated to euglycemia High-dose IV insulin therapy  Assessment: 51 y.o. female who comes in with cardiac arrest with concern for beta-blocker (extended release), indomethacin, and possible amiodarone overdose.   Baseline K+  6.2   Goal of Therapy:  BG 150-250 mg/dL Electrolytes (K+)  WNL   Plan:  Stat baseline K+ Continue high-dose insulin therapy Check K+ every 2 hours while high-dose insulin therapy running Maintain K > 2.5 mmol/L Initiate D10% infusion at 0.5 mg/kg/hr (385 mL/hr), titrate to euglycemia while on insulin therapy CBG every 30 minutes until BG stable, then every 2 hours Goal BG 150-250 mg/dL Repeat Mag and Phos with AM labs Monitor TG if Lipid therapy continued   Dorothe Pea, PharmD, BCPS Clinical Pharmacist   12/20/2021,10:09 AM

## 2021-12-23 NOTE — Consult Note (Signed)
Consultation Note Date: 12/10/2021   Patient Name: Latoya [name]  DOB: Aug 15, 1970  MRN: [id number]  Age / Sex: 51 y.o., female  PCP: Cletis Athens, MD Referring Physician: Flora Lipps, MD  Reason for Consultation: Establishing goals of care  HPI/Patient Profile: 51 y.o. female who comes in with cardiac arrest.   Patient was noted to be acting very bizarre at home thrashing around there was concern  overdose.   When she got in the EMS truck she lost pulses.   They coded patient for 30 minutes and gave 1 mg of glucagon  +beta-blocker overdose although she might of taken some of her husbands amiodarone as well.    Clinical Assessment and Goals of Care: Notes and labs reviewed. Spoke with CCM team. Discussed status in detail.   Patient is resting in bed with EEG in progress. Spoke with husband privately. He discusses her alcoholism. He discusses lifestyle choices.  He discusses her previous marriages and family. He discusses their marriage and recent events with infidelity. He discusses his end stage heart failure and his thoughts. He discusses his brushes with death in the past.   We discussed her diagnosis, prognosis, GOC, EOL wishes disposition and options.  Created space and opportunity for patient  to explore thoughts and feelings regarding current medical information.   A detailed discussion was had today regarding advanced directives.  Concepts specific to code status, artifical feeding and hydration, IV antibiotics and rehospitalization were discussed.  The difference between an aggressive medical intervention path and a comfort care path was discussed.  Values and goals of care important to patient and family were attempted to be elicited.  Discussed limitations of medical interventions to prolong quality of life in some situations and discussed the concept of human mortality.  Much  conversation about the circumstances surrounding this admission. Discussion on QOL and what would be acceptable. He understands her very poor prognosis. He is waiting for patient's daughter to come to bedside and then they will make decisions from there. Discussed that I would follow up tomorrow.      SUMMARY OF RECOMMENDATIONS    Discussed acceptable QOL. Discussed upcoming decisions. He states he does not want her to suffer.         Primary Diagnoses: Present on Admission:  Drug overdose   I have reviewed the medical record, interviewed the patient and family, and examined the patient. The following aspects are pertinent.  Past Medical History:  Diagnosis Date   Anxiety    Condyloma acuminata    Depression    Hematuria    PE (pulmonary embolism)    Social History   Socioeconomic History   Marital status: Married    Spouse name: Not on file   Number of children: Not on file   Years of education: Not on file   Highest education level: Not on file  Occupational History   Not on file  Tobacco Use   Smoking status: Every Day    Types: E-cigarettes   Smokeless tobacco: Never  Vaping Use   Vaping Use: Every day  Substance and Sexual Activity   Alcohol use: Yes    Alcohol/week: 21.0 standard drinks of alcohol    Types: 21 Glasses of wine per week   Drug use: No   Sexual activity: Yes    Birth control/protection: Surgical  Other Topics Concern   Not on file  Social History Narrative   Right handed    Social Determinants of Health   Financial Resource Strain: Not on file  Food Insecurity: Not on file  Transportation Needs: Not on file  Physical Activity: Not on file  Stress: Not on file  Social Connections: Not on file   Family History  Problem Relation Age of Onset   Pulmonary embolism Mother    Lymphoma Father    Multiple sclerosis Sister    Lymphoma Brother    Scheduled Meds:  charcoal activated (NO SORBITOL)  50 g Per Tube Once   EPINEPHrine        glucagon (human recombinant)  5 mg Intravenous Once   heparin injection (subcutaneous)  5,000 Units Subcutaneous Q8H   pantoprazole  40 mg Per Tube Daily   pantoprazole (PROTONIX) IV  40 mg Intravenous Q12H   sodium bicarbonate       Continuous Infusions:  sodium chloride     dextrose 385 mL/hr at 12/30/2021 1124   DOPamine 10 mcg/kg/min (12/22/2021 1132)   epinephrine 6 mcg/min (12/28/2021 0938)   fat emul(INTRAlipid)     insulin HIGH DOSE (4 units/mL) infusion for beta blocker/calcium channel blocker overdose 2 Units/kg/hr (12/07/2021 1136)   [START ON 2022/01/18] levETIRAcetam     midazolam 0.5 mg/hr (12/30/2021 1424)   norepinephrine (LEVOPHED) Adult infusion 25 mcg/min (12/27/2021 1329)   PRN Meds:.dextrose, EPINEPHrine, sodium bicarbonate Medications Prior to Admission:  Prior to Admission medications   Medication Sig Start Date End Date Taking? Authorizing Provider  lisdexamfetamine (VYVANSE) 50 MG capsule Take 1 capsule (50 mg total) by mouth daily. 12/23/15   Shambley, Melody N, CNM  ofloxacin (FLOXIN OTIC) 0.3 % OTIC solution Place 5 drops into the right ear 2 (two) times daily. Patient not taking: Reported on 12/12/2021 09/15/21   Cletis Athens, MD  propranolol ER (INDERAL LA) 80 MG 24 hr capsule Take 1 capsule (80 mg total) by mouth daily. 05/24/21   Cletis Athens, MD  rivaroxaban (XARELTO) 20 MG TABS tablet Take 1 tablet (20 mg total) by mouth daily. 05/24/21   Cletis Athens, MD  rosuvastatin (CRESTOR) 20 MG tablet Take 1 tablet (20 mg total) by mouth daily. 09/22/21   Cletis Athens, MD  Venlafaxine HCl 225 MG TB24 Take 1 tablet by mouth once daily 12/20/21   Cletis Athens, MD   Allergies  Allergen Reactions   Biaxin [Clarithromycin] Nausea Only   Review of Systems  Unable to perform ROS   Physical Exam Constitutional:      Comments: Eyes opening spontaneously.   Pulmonary:     Comments: On ventilator.     Vital Signs: BP 111/73   Pulse (!) 112   Temp (!) 96.1 F (35.6 C)    Resp (!) 24   Ht '5\' 7"'$  (1.702 m)   Wt 77.1 kg   SpO2 (!) 49%   BMI 26.63 kg/m  Pain Scale: CPOT       SpO2: SpO2: (!) 49 % O2 Device:SpO2: (!) 49 % O2 Flow Rate: .   IO: Intake/output summary: No intake or output data in the 24 hours ending 12/31/2021 1507  LBM:   Baseline Weight: Weight: 77.1 kg Most recent weight: Weight: 77.1 kg       Signed by: Asencion Gowda, NP   Please contact Palliative Medicine Team phone at [phone] for questions and concerns.  For individual provider: See Shea Evans

## 2021-12-23 NOTE — Code Documentation (Signed)
Pacing patient at 70 BPM at 64 MA

## 2021-12-23 NOTE — Code Documentation (Signed)
Unable to visualize chords. Intubation unsuccessful at this time

## 2021-12-23 NOTE — Progress Notes (Signed)
Lab notified this RN of difficult blood draw from foot. Offered draw from US guided PIV after obtaining verbal order from NP [facility]. Updated lab tech that US guided IV could be used. They declined at this time stating the previous labs had been drawn from the site and felt the results were inaccurate.

## 2021-12-23 NOTE — Progress Notes (Signed)
An USGPIV (ultrasound guided PIV) has been placed for short-term vasopressor infusion. A correctly placed ivWatch must be used when administering Vasopressors. Should this treatment be needed beyond 72 hours, central line access should be obtained.  It will be the responsibility of the bedside nurse to follow best practice to prevent extravasations.   ?

## 2021-12-23 NOTE — Code Documentation (Signed)
Intralipid infusion complete

## 2021-12-23 NOTE — ED Provider Notes (Signed)
[facility] Provider Note    Event Date/Time   First MD Initiated Contact with Patient 12/07/2021 0745     (approximate)   History   Ingestion   HPI  Latoya [name] is a 51 y.o. female who comes in with cardiac arrest.  Patient was noted to be acting very bizarre at home thrashing around there was concern for a possible overdose.  When she got in the EMS truck she lost pulses.  They coded patient for 30 minutes and gave 1 mg of glucagon given that although they hAD.  It was presumed to be a possible beta-blocker overdose although she might of taken some of her husbands amiodarone as well.  Patient came in unresponsive pupils dilated and nonreactive but unclear if there was any other medications.   Physical Exam   Triage Vital Signs: ED Triage Vitals  Enc Vitals Group     BP 12/18/2021 0800 (!) 95/54     Pulse Rate 12/04/2021 0758 (!) 54     Resp 12/09/2021 0800 14     Temp --      Temp src --      SpO2 12/11/2021 0756 (!) 56 %     Weight 12/06/2021 0757 170 lb (77.1 kg)     Height --      Head Circumference --      Peak Flow --      Pain Score --      Pain Loc --      Pain Edu? --      Excl. in [place]? --     Most recent vital signs: Vitals:   12/28/2021 0850 12/27/2021 0851  BP: (!) 63/34 (!) 63/34  Pulse:  (!) 25  Resp: 18 18  SpO2:  (!) 69%     General: Obtunded CV:  No pulse Resp:  No spontaneous respirations Abd:  No distention.  Other:  Patient has dilated pupils that are unreactive.  She has no spontaneous movements and is currently pulseless getting compressions with Linton Rump device.   ED Results / Procedures / Treatments   Labs (all labs ordered are listed, but only abnormal results are displayed) Labs Reviewed  CBC WITH DIFFERENTIAL/PLATELET - Abnormal; Notable for the following components:      Result Value   RBC 3.63 (*)    MCV 105.8 (*)    Lymphs Abs 4.2 (*)    Abs Immature Granulocytes 0.23 (*)    All other components within  normal limits  CBG MONITORING, ED - Abnormal; Notable for the following components:   Glucose-Capillary 288 (*)    All other components within normal limits  SALICYLATE LEVEL  ACETAMINOPHEN LEVEL  ETHANOL  URINE DRUG SCREEN, QUALITATIVE ([facility] ONLY)  COMPREHENSIVE METABOLIC PANEL  MAGNESIUM  POTASSIUM  POTASSIUM  POTASSIUM  POTASSIUM  POTASSIUM  POTASSIUM  POTASSIUM  CBG MONITORING, ED  POC URINE PREG, ED     EKG  My interpretation of EKG:  Initial EKG you had a lot of artifact that was reading as a acute MI but is not consistent with an acute MI due to the artifact.  Will get repeat EKG  Patient's second EKG shows a possible sinus bradycardia with PVCs without any ST elevation, T wave version in V2, right bundle branch block  RADIOLOGY  PROCEDURES:  Critical Care performed: Yes, see critical care procedure note(s)  .1-3 Lead EKG Interpretation  Performed by: Vanessa Garretson, MD Authorized by: Vanessa Turon, MD  Interpretation: abnormal     ECG rate:  40   ECG rate assessment: bradycardic     Rhythm: sinus bradycardia     Ectopy: PVCs     Conduction: normal   .Critical Care  Performed by: Vanessa Hollenberg, MD Authorized by: Vanessa Mead, MD   Critical care provider statement:    Critical care time (minutes):  75   Critical care was necessary to treat or prevent imminent or life-threatening deterioration of the following conditions:  Cardiac failure   Critical care was time spent personally by me on the following activities:  Development of treatment plan with patient or surrogate, discussions with consultants, evaluation of patient's response to treatment, examination of patient, ordering and review of laboratory studies, ordering and review of radiographic studies, ordering and performing treatments and interventions, pulse oximetry, re-evaluation of patient's condition and review of old charts    MEDICATIONS ORDERED IN ED: Medications  EPINEPHrine  (ADRENALIN) 1 MG/10ML injection (1 mg Intravenous Given 12/31/2021 0830)  sodium bicarbonate injection (50 mEq Intravenous Given 12/08/2021 0753)  calcium chloride injection (1 g Intravenous Given 12/26/2021 0748)  glucagon (human recombinant) (GLUCAGEN) injection 5 mg (has no administration in time range)  fat emul(INTRAlipid) 20 % infusion 250 mL (has no administration in time range)  insulin HIGH DOSE (4 units/mL) infusion for beta blocker/calcium channel blocker overdose (1 Units/kg/hr  77.1 kg Intravenous New Bag/Given 12/27/2021 0842)  dextrose 50 % solution 38.55 g (has no administration in time range)  glucagon (GLUCAGEN) injection (3 mg Intravenous Given 12/03/2021 0757)  atropine injection (1 mg Intravenous Given 12/05/2021 0808)  glucagon (GLUCAGEN) injection (2 mg Intravenous Given 12/16/2021 0811)  EPINEPHrine (ADRENALIN) 1 MG/10ML injection (1 mg Intravenous Given 12/03/2021 0833)  dextrose 10 % infusion (has no administration in time range)  EPINEPHrine (ADRENALIN) 1 MG/10ML injection (has no administration in time range)  insulin HIGH DOSE bolus via infusion (for beta blocker / calcium channel blocker overdose) (77.1 Units Intravenous Bolus from Bag 12/16/2021 0847)     IMPRESSION / MDM / [place] / ED COURSE  I reviewed the triage vital signs and the nursing notes.   Patient's presentation is most consistent with acute presentation with potential threat to life or bodily function.   Patient comes in with cardiac arrest secondary to presumed beta-blocker may be amiodarone overdose.  H's and T's were initially addressed and patient was intubated.  Patient was given the 3 mg of glucagon and patient had return of rhythm.  She was down for maybe a about 10 minutes with me for a total of 40 minutes.  After pulse was returned patient was bradycardic so given some atropine started on Levophed and started to pace patient with good capture  Patient had pulses return and patient had full initiation  of beta-blocker reversal ordered.  Lipids were ordered insulin high-dose ordered but waiting for pharmacy to get.  Once we got these these were also started.  Patient had pulses for about 10 to 15 minutes and I discussed with the ICU team Dr. Rich Number as well as Dr. Ellyn Hack from the STEMI team to discuss temporary wire pacing.  He was going to send Dr. Rockey Situ down for patient evaluation.  Soon after talking with the ICU and cardiology team patient lost pulses again.  Family then was there and I discussed with patient's husband about my concern for poor neurological outcome.  Family, the husband reported that he had gotten into an argument with her about how  she had been cheating on him and he had said that he was going to divorce her and when he left the house she presumably took all of these medications.  During our discussion with the husband and the ICU team and Dr. Rockey Situ is at bedside.  Upon their evaluation they agreed that patient has poor neurological function but patient did gain pulses back and after extensive discussion with my consultations we have decided that if patient loses pulses again that given poor neurological outcome that we would not continue resuscitation.  Patient is currently on Levophed, dopamine infusions and getting high-dose insulin.  Patient will be admitted to the ICU team for further work-up and care   The patient is on the cardiac monitor to evaluate for evidence of arrhythmia and/or significant heart rate changes.      FINAL CLINICAL IMPRESSION(S) / ED DIAGNOSES   Final diagnoses:  Cardiac arrest ([place])  Bradycardia  Intentional overdose, initial encounter [facility])     Rx / DC Orders   ED Discharge Orders     None        Note:  This document was prepared using Dragon voice recognition software and may include unintentional dictation errors.   Vanessa Dickens, MD 01/01/2022 [phone]

## 2021-12-23 NOTE — Consult Note (Signed)
[place] for High-dose insulin therapy Indication: BB overdose   Patient Measurements: Height: '5\' 7"'$  (170.2 cm) Weight: 77.1 kg (170 lb) IBW/kg (Calculated) : 61.6  Vital Signs: Temp: 96.4 F (35.8 C) (09/21 1600) Temp Source: Esophageal (09/21 1106) BP: 131/95 (09/21 1600) Pulse Rate: 52 (09/21 1600) Intake/Output from previous day: No intake/output data recorded. Intake/Output from this shift: Total I/O In: 3127.8 [I.V.:2838.2; IV Piggyback:289.6] Out: -   Labs: Recent Labs    12/09/2021 0800 12/04/2021 1545  WBC 9.2  --   HGB 12.3  --   HCT 38.4  --   PLT 219  --   APTT  --  35  CREATININE 1.29* 0.84  MG 2.7*  --   PHOS  --  2.4*  ALBUMIN 2.8* 2.2*  PROT 5.2*  --   AST 376*  --   ALT 317*  --   ALKPHOS 53  --   BILITOT 0.9  --     Estimated Creatinine Clearance: 83.9 mL/min (by C-G formula based on SCr of 0.84 mg/dL).  Medical History: Past Medical History:  Diagnosis Date   Anxiety    Condyloma acuminata    Depression    Hematuria    PE (pulmonary embolism)     Medications:  D10% infusion at 385 mL/hr (order range 300 - 600 mL/hr) Insulin infusion at 3 units/kg/hr (order range 0-10 units/kg/hr)  Assessment: 51 y.o. female who comes in with cardiac arrest with concern for beta-blocker (extended release), indomethacin, and possible amiodarone overdose. Team in contact with poison control.  Goal of Therapy:  BG 150-250 mg/dL Electrolytes (K+) within normal limits   Plan:  --Continue to titrate insulin infusion as able while maintaining blood glucose range as above Monitor volume status / I&Os closely Plan on scheduling 1 amp of D50w every hour (25 g dextrose) and go ahead and cut D10w infusion in half (38 >> 19 g dextrose / hr) and see if there's room to continue to drop that rate and conserve volume --K+ checks q4h K 6.2 >> 3.6>3.1 & 2.2 reported at 1545. Will order stat repeat x1 to confirm value. Scr  1.29>0.84 (aggressively rehydrated via MIVF above) - Pt anuric currently. Watch and replete cautiously. Na 139>136 (watch D10w gtt &  D50W boluses above) --CBG every 30 minutes until BG stable, then every 2 hours --Repeat Mag and Phos with AM labs --Monitor TG if lipid therapy continued (baseline TG 205)  Latoya [name] 12/14/2021,4:40 PM

## 2021-12-23 NOTE — ED Triage Notes (Signed)
Pt found with multiple empty beta blocker bottles. 30 minute downtime. CPR in progress

## 2021-12-23 NOTE — Procedures (Signed)
Family has yet to see patient and other procedures being performed . Will wait outside room and do Eeg as soon as possible.

## 2021-12-24 ENCOUNTER — Encounter: Admission: EM | Disposition: E | Payer: Self-pay | Source: Home / Self Care | Attending: Internal Medicine

## 2021-12-24 ENCOUNTER — Encounter: Payer: Self-pay | Admitting: Certified Registered"

## 2021-12-24 ENCOUNTER — Inpatient Hospital Stay (HOSPITAL_COMMUNITY)
Admit: 2021-12-24 | Discharge: 2021-12-24 | Disposition: A | Discharge status: Home / Self Care | Payer: Commercial Managed Care - PPO | Attending: Critical Care Medicine | Admitting: Critical Care Medicine

## 2021-12-24 DIAGNOSIS — I469 Cardiac arrest, cause unspecified: Secondary | ICD-10-CM

## 2021-12-24 DIAGNOSIS — R569 Unspecified convulsions: Secondary | ICD-10-CM

## 2021-12-24 LAB — GLUCOSE, CAPILLARY
Glucose-Capillary: 145 mg/dL — ABNORMAL HIGH (ref 70–99)
Glucose-Capillary: 161 mg/dL — ABNORMAL HIGH (ref 70–99)
Glucose-Capillary: 170 mg/dL — ABNORMAL HIGH (ref 70–99)
Glucose-Capillary: 174 mg/dL — ABNORMAL HIGH (ref 70–99)
Glucose-Capillary: 177 mg/dL — ABNORMAL HIGH (ref 70–99)
Glucose-Capillary: 183 mg/dL — ABNORMAL HIGH (ref 70–99)
Glucose-Capillary: 195 mg/dL — ABNORMAL HIGH (ref 70–99)
Glucose-Capillary: 195 mg/dL — ABNORMAL HIGH (ref 70–99)
Glucose-Capillary: 204 mg/dL — ABNORMAL HIGH (ref 70–99)
Glucose-Capillary: 209 mg/dL — ABNORMAL HIGH (ref 70–99)
Glucose-Capillary: 213 mg/dL — ABNORMAL HIGH (ref 70–99)
Glucose-Capillary: 216 mg/dL — ABNORMAL HIGH (ref 70–99)
Glucose-Capillary: 220 mg/dL — ABNORMAL HIGH (ref 70–99)
Glucose-Capillary: 245 mg/dL — ABNORMAL HIGH (ref 70–99)
Glucose-Capillary: 252 mg/dL — ABNORMAL HIGH (ref 70–99)
Glucose-Capillary: 256 mg/dL — ABNORMAL HIGH (ref 70–99)
Glucose-Capillary: 271 mg/dL — ABNORMAL HIGH (ref 70–99)
Glucose-Capillary: 275 mg/dL — ABNORMAL HIGH (ref 70–99)
Glucose-Capillary: 291 mg/dL — ABNORMAL HIGH (ref 70–99)
Glucose-Capillary: 292 mg/dL — ABNORMAL HIGH (ref 70–99)
Glucose-Capillary: 293 mg/dL — ABNORMAL HIGH (ref 70–99)
Glucose-Capillary: 297 mg/dL — ABNORMAL HIGH (ref 70–99)
Glucose-Capillary: 312 mg/dL — ABNORMAL HIGH (ref 70–99)
Glucose-Capillary: 324 mg/dL — ABNORMAL HIGH (ref 70–99)
Glucose-Capillary: 332 mg/dL — ABNORMAL HIGH (ref 70–99)

## 2021-12-24 LAB — COMPREHENSIVE METABOLIC PANEL
ALT: 588 U/L — ABNORMAL HIGH (ref 0–44)
AST: 421 U/L — ABNORMAL HIGH (ref 15–41)
Albumin: 2.7 g/dL — ABNORMAL LOW (ref 3.5–5.0)
Alkaline Phosphatase: 66 U/L (ref 38–126)
Anion gap: 7 (ref 5–15)
BUN: 13 mg/dL (ref 6–20)
CO2: 25 mmol/L (ref 22–32)
Calcium: 7.1 mg/dL — ABNORMAL LOW (ref 8.9–10.3)
Chloride: 98 mmol/L (ref 98–111)
Creatinine, Ser: 1.11 mg/dL — ABNORMAL HIGH (ref 0.44–1.00)
GFR, Estimated: 60 mL/min — ABNORMAL LOW (ref >60)
Glucose, Bld: 245 mg/dL — ABNORMAL HIGH (ref 70–99)
Potassium: 2.5 mmol/L — CL (ref 3.5–5.1)
Sodium: 130 mmol/L — ABNORMAL LOW (ref 135–145)
Total Bilirubin: 0.8 mg/dL (ref 0.3–1.2)
Total Protein: 5.1 g/dL — ABNORMAL LOW (ref 6.5–8.1)

## 2021-12-24 LAB — URINE DRUG SCREEN, QUALITATIVE (ARMC ONLY)
Amphetamines, Ur Screen: NOT DETECTED
Barbiturates, Ur Screen: NOT DETECTED
Benzodiazepine, Ur Scrn: POSITIVE — AB
Cannabinoid 50 Ng, Ur ~~LOC~~: NOT DETECTED
Cocaine Metabolite,Ur ~~LOC~~: NOT DETECTED
MDMA (Ecstasy)Ur Screen: NOT DETECTED
Methadone Scn, Ur: NOT DETECTED
Opiate, Ur Screen: NOT DETECTED
Phencyclidine (PCP) Ur S: NOT DETECTED
Tricyclic, Ur Screen: NOT DETECTED

## 2021-12-24 LAB — MAGNESIUM
Magnesium: 1.3 mg/dL — ABNORMAL LOW (ref 1.7–2.4)
Magnesium: 1.2 mg/dL — ABNORMAL LOW (ref 1.7–2.4)

## 2021-12-24 LAB — ECHOCARDIOGRAM COMPLETE
Height: 67 in
S' Lateral: 1.8 cm
Weight: 3245.17 oz

## 2021-12-24 LAB — POTASSIUM
Potassium: 3.6 mmol/L (ref 3.5–5.1)
Potassium: 3.6 mmol/L (ref 3.5–5.1)

## 2021-12-24 LAB — PHOSPHORUS
Phosphorus: 1.2 mg/dL — ABNORMAL LOW (ref 2.5–4.6)
Phosphorus: 1.2 mg/dL — ABNORMAL LOW (ref 2.5–4.6)

## 2021-12-24 LAB — TRIGLYCERIDES: Triglycerides: 113 mg/dL (ref <150)

## 2021-12-24 SURGERY — SURGICAL PROCUREMENT, ORGAN
Anesthesia: Choice

## 2021-12-24 MED ORDER — HALOPERIDOL LACTATE 5 MG/ML IJ SOLN: 2.5000 mg | INTRAMUSCULAR | Status: DC | PRN

## 2021-12-24 MED ORDER — POLYVINYL ALCOHOL 1.4 % OP SOLN: 1.0000 [drp] | Freq: Four times a day (QID) | OPHTHALMIC | Status: DC | PRN

## 2021-12-24 MED ORDER — MORPHINE BOLUS VIA INFUSION: 5.0000 mg | INTRAVENOUS | Status: DC | PRN

## 2021-12-24 MED ORDER — ACETAMINOPHEN 650 MG RE SUPP: 650.0000 mg | Freq: Four times a day (QID) | RECTAL | Status: DC | PRN

## 2021-12-24 MED ORDER — ACETAMINOPHEN 325 MG PO TABS: 650.0000 mg | ORAL_TABLET | Freq: Four times a day (QID) | ORAL | Status: DC | PRN

## 2021-12-24 MED ORDER — MORPHINE 100MG IN NS 100ML (1MG/ML) PREMIX INFUSION
0.0000 mg/h | INTRAVENOUS | Status: DC
  Administered 2021-12-24: 5 mg/h via INTRAVENOUS
  Filled 2021-12-24: qty 100

## 2021-12-24 MED ORDER — CHLORHEXIDINE GLUCONATE CLOTH 2 % EX PADS: 6.0000 | MEDICATED_PAD | Freq: Every day | CUTANEOUS | Status: DC

## 2021-12-24 MED ORDER — SODIUM CHLORIDE 0.9 % IV SOLN: INTRAVENOUS | Status: DC

## 2021-12-24 MED ORDER — GLYCOPYRROLATE 0.2 MG/ML IJ SOLN: 0.2000 mg | INTRAMUSCULAR | Status: DC | PRN

## 2021-12-24 MED ORDER — GLYCOPYRROLATE 1 MG PO TABS: 1.0000 mg | ORAL_TABLET | ORAL | Status: DC | PRN

## 2021-12-24 MED FILL — Medication: Qty: 1 | Status: AC

## 2021-12-24 SURGICAL SUPPLY — 71 items
BLADE CLIPPER SURG (BLADE) ×1 IMPLANT
BLADE STERNUM SYSTEM 6 (BLADE) ×1 IMPLANT
CHLORAPREP W/TINT 26 (MISCELLANEOUS) ×2 IMPLANT
CLIP TI MEDIUM 6 (CLIP) ×1 IMPLANT
CLIP TI WIDE RED SMALL 6 (CLIP) ×1 IMPLANT
CNTNR SPEC 2.5X3XGRAD LEK (MISCELLANEOUS) ×1
CONT SPEC 4OZ STER OR WHT (MISCELLANEOUS) ×1
CONTAINER SPEC 2.5X3XGRAD LEK (MISCELLANEOUS) ×1 IMPLANT
COVER BACK TABLE 60X90IN (DRAPES) ×1 IMPLANT
COVER LIGHT HANDLE STERIS (MISCELLANEOUS) IMPLANT
COVER MAYO STAND STRL (DRAPES) IMPLANT
DRAPE 3/4 80X56 (DRAPES) IMPLANT
DRAPE LAPAROTOMY 100X77 ABD (DRAPES) ×1 IMPLANT
DRSG OPSITE POSTOP 4X12 (GAUZE/BANDAGES/DRESSINGS) IMPLANT
DRSG OPSITE POSTOP 4X14 (GAUZE/BANDAGES/DRESSINGS) IMPLANT
DRSG OPSITE POSTOP 4X8 (GAUZE/BANDAGES/DRESSINGS) IMPLANT
DRSG TELFA 3X8 NADH STRL (GAUZE/BANDAGES/DRESSINGS) ×1 IMPLANT
ELECT BLADE 6.5 EXT (BLADE) IMPLANT
ELECT REM PT RETURN 9FT ADLT (ELECTROSURGICAL)
ELECTRODE REM PT RTRN 9FT ADLT (ELECTROSURGICAL) IMPLANT
GAUZE 4X4 16PLY ~~LOC~~+RFID DBL (SPONGE) ×1 IMPLANT
GAUZE SPONGE 4X4 12PLY STRL (GAUZE/BANDAGES/DRESSINGS) IMPLANT
GOWN STRL REUS W/ TWL LRG LVL3 (GOWN DISPOSABLE) ×4 IMPLANT
GOWN STRL REUS W/TWL LRG LVL3 (GOWN DISPOSABLE) ×4
HANDLE SUCTION POOLE (INSTRUMENTS) ×2 IMPLANT
HANDLE YANKAUER SUCT BULB TIP (MISCELLANEOUS) ×2 IMPLANT
KIT POST MORTEM ADULT 36X90 (BAG) ×1 IMPLANT
KIT TURNOVER KIT A (KITS) ×1 IMPLANT
LOOP RED MAXI  1X406MM (MISCELLANEOUS) ×1
LOOP VESSEL MAXI 1X406 RED (MISCELLANEOUS) ×1 IMPLANT
LOOP VESSEL MINI 0.8X406 BLUE (MISCELLANEOUS) ×1 IMPLANT
LOOPS BLUE MINI 0.8X406MM (MISCELLANEOUS) ×1
MANIFOLD NEPTUNE II (INSTRUMENTS) ×3 IMPLANT
MAT ABSORB  FLUID 56X50 GRAY (MISCELLANEOUS) ×2
MAT ABSORB FLUID 56X50 GRAY (MISCELLANEOUS) ×2 IMPLANT
NEEDLE BIOPSY 14X6 SOFT TISS (NEEDLE) IMPLANT
PACK BASIN MAJOR ARMC (MISCELLANEOUS) ×1 IMPLANT
PACK UNIVERSAL (MISCELLANEOUS) ×1 IMPLANT
SPONGE KITTNER 5P (MISCELLANEOUS) ×1 IMPLANT
SPONGE T-LAP 18X18 ~~LOC~~+RFID (SPONGE) ×5 IMPLANT
STAPLER SKIN PROX 35W (STAPLE) ×1 IMPLANT
SUCTION POOLE HANDLE (INSTRUMENTS) ×2
SUT BONE WAX W31G (SUTURE) ×1 IMPLANT
SUT ETHIBOND #5 BRAIDED 30INL (SUTURE) IMPLANT
SUT ETHIBOND 2 V 37 (SUTURE) IMPLANT
SUT ETHIBOND NAB CT1 #1 30IN (SUTURE) ×3 IMPLANT
SUT ETHILON 2 0 FS 18 (SUTURE) ×1 IMPLANT
SUT ETHILON NAB BLK LR #2 30IN (SUTURE) IMPLANT
SUT PROLENE 4 0 RB 1 (SUTURE)
SUT PROLENE 4 0 SH DA (SUTURE) ×3 IMPLANT
SUT PROLENE 4-0 RB1 .5 CRCL 36 (SUTURE) IMPLANT
SUT PROLENE 6 0 BV (SUTURE) IMPLANT
SUT SILK 0 (SUTURE) ×1
SUT SILK 0 30XBRD TIE 6 (SUTURE) ×1 IMPLANT
SUT SILK 1 SH (SUTURE) IMPLANT
SUT SILK 1 TIES 10/18 (SUTURE) ×1 IMPLANT
SUT SILK 2 0 (SUTURE) ×1
SUT SILK 2 0 SH (SUTURE) IMPLANT
SUT SILK 2 0 SH CR/8 (SUTURE) IMPLANT
SUT SILK 2-0 18XBRD TIE 12 (SUTURE) ×1 IMPLANT
SUT SILK 3 0 (SUTURE)
SUT SILK 3-0 18XBRD TIE 12 (SUTURE) IMPLANT
SYR 10ML LL (SYRINGE) ×1 IMPLANT
SYR 20ML LL LF (SYRINGE) ×1 IMPLANT
SYR TOOMEY 50ML (SYRINGE) IMPLANT
TAPE UMBILICAL 1/8 X36 TWILL (MISCELLANEOUS) ×1 IMPLANT
TOWEL OR 17X26 4PK STRL BLUE (TOWEL DISPOSABLE) ×2 IMPLANT
TRAP FLUID SMOKE EVACUATOR (MISCELLANEOUS) ×1 IMPLANT
TUBING CONNECTING 10 (TUBING) ×2 IMPLANT
WATER STERILE IRR 1000ML POUR (IV SOLUTION) ×1 IMPLANT
WATER STERILE IRR 500ML POUR (IV SOLUTION) ×1 IMPLANT

## 2022-01-02 NOTE — Death Summary Note (Signed)
DEATH SUMMARY   Patient Details  Name: Latoya [name] MRN: [id number] DOB: [birth year]  Admission/Discharge Information   Admit Date:  28-Dec-2021  Date of Death: Date of Death: 2021-12-29  Time of Death: Time of Death: 1301/06/20  Length of Stay: 1  Referring Physician: Cletis Athens, MD   Reason(s) for Hospitalization  Cardiac Arrest due to Beta Blocker toxicity Cardiogenic Shock Acute Hypoxic Respiratory Failure Severe Anoxic Brain Injury Myoclonic Seizures Acute Liver Failure Acute Kidney Injury Anion Gap Metabolic Acidosis Lactic Acidosis Hyperkalemia Hypermagnesemia   Diagnoses  Preliminary cause of death:  Cardiac arrest due to beta blocker overdose Secondary Diagnoses (including complications and co-morbidities):  Principal Problem:   Drug overdose Active Problems:   Bradycardia   Cardiac arrest ([place])   Hypotension due to drugs   Seizure ([place]) Cardiogenic Shock Acute Hypoxic Respiratory Failure Anoxic Brain Injury Acute Liver Failure Acute Kidney Injury Anion Gap Metabolic Acidosis Lactic Acidosis Hyperkalemia Hypermagnesemia  Brief Hospital Course (including significant findings, care, treatment, and services provided and events leading to death)  Latoya [name] is a 51 y.o. year old female  who comes in with cardiac arrest.   Patient was noted to be acting very bizarre at home thrashing around there was concern overdose.   When she got in the EMS truck she lost pulses.   They coded patient for 30 minutes and gave 1 mg of glucagon  +beta-blocker overdose although she might of taken some of her husbands amiodarone as well.     Patient came in unresponsive pupils dilated and nonreactive but unclear if there was any other medications.   Patient subsequently had LUCAS device in place and had an additional cardiac arrest in ER for 15 mins   NEUROLOGICAL EXAMINATION IS C?W SEVERE BRAIN DAMAGE, and noted to exhibit myoclonic seizures.   Patient supposedly  has taken indomethacin, extended release Beta blocker toxicity and poisoning.  MRI brain was consistent with severe anoxic brain injury. There is also secondary diffuse cerebral and cerebellar edema with basilar cistern effacement and transtentorial herniation at the foramen magnum.Loss of normal flow voids within the internal carotid arteries bilaterally, suggesting slow flow and/or occlusion. This is likely related to global cerebral edema and elevated intracranial pressure.  Reviewed patient's worsening lab, ABGs, vital signs including unstable HR and blood pressure.  Given MRI Brain results and continued decline, pt's family elected to transition to comfort care.  Pt expired shortly after care withdrawn.   Pertinent Labs and Studies  Significant Diagnostic Studies ECHOCARDIOGRAM COMPLETE  Result Date: 29-Dec-2021    ECHOCARDIOGRAM REPORT   Patient Name:   MRS. Latoya [name] Date of Exam: 29-Dec-2021 Medical Rec #:  [id number]               Height:       67.0 in Accession #:    [id number]              Weight:       202.8 lb Date of Birth:  26-Jul-1970               BSA:          2.034 m Patient Age:    51 years                BP:           110/78 mmHg Patient Gender: F  HR:           74 bpm. Exam Location:  [facility] Procedure: 2D Echo, Cardiac Doppler and Color Doppler Indications:     Cardiac arrest I46.9  History:         Patient has no prior history of Echocardiogram examinations.                  Anxiety, pulmonary embolism.  Sonographer:     Sherrie Sport Referring Phys:  [id number] Teressa Lower Diagnosing Phys: Ida Rogue MD  Sonographer Comments: Echo performed with patient supine and on artificial respirator. The only view obtainable was parasternal. IMPRESSIONS  1. Left ventricular ejection fraction, by estimation, is 60 to 65%. The left ventricle has normal function. The left ventricle has no regional wall motion abnormalities. Left ventricular diastolic parameters are  indeterminate.  2. Right ventricular systolic function is normal. The right ventricular size is normal. The estimated right ventricular systolic pressure is 62.5 mmHg.  3. The mitral valve is normal in structure. No evidence of mitral valve regurgitation. No evidence of mitral stenosis.  4. The aortic valve was not well visualized. Aortic valve regurgitation is not visualized. No aortic stenosis is present.  5. The inferior vena cava is normal in size with greater than 50% respiratory variability, suggesting right atrial pressure of 3 mmHg. FINDINGS  Left Ventricle: Left ventricular ejection fraction, by estimation, is 60 to 65%. The left ventricle has normal function. The left ventricle has no regional wall motion abnormalities. The left ventricular internal cavity size was normal in size. There is  no left ventricular hypertrophy. Left ventricular diastolic parameters are indeterminate. Right Ventricle: The right ventricular size is normal. No increase in right ventricular wall thickness. Right ventricular systolic function is normal. The tricuspid regurgitant velocity is 1.96 m/s, and with an assumed right atrial pressure of 5 mmHg, the estimated right ventricular systolic pressure is 63.8 mmHg. Left Atrium: Left atrial size was normal in size. Right Atrium: Right atrial size was normal in size. Pericardium: There is no evidence of pericardial effusion. Mitral Valve: The mitral valve is normal in structure. No evidence of mitral valve regurgitation. No evidence of mitral valve stenosis. Tricuspid Valve: The tricuspid valve is normal in structure. Tricuspid valve regurgitation is mild . No evidence of tricuspid stenosis. Aortic Valve: The aortic valve was not well visualized. Aortic valve regurgitation is not visualized. No aortic stenosis is present. Pulmonic Valve: The pulmonic valve was normal in structure. Pulmonic valve regurgitation is not visualized. No evidence of pulmonic stenosis. Aorta: The aortic root  is normal in size and structure. Venous: The inferior vena cava is normal in size with greater than 50% respiratory variability, suggesting right atrial pressure of 3 mmHg. IAS/Shunts: No atrial level shunt detected by color flow Doppler.  LEFT VENTRICLE PLAX 2D LVIDd:         3.00 cm LVIDs:         1.80 cm LV PW:         1.70 cm LV IVS:        1.10 cm LVOT diam:     2.00 cm LVOT Area:     3.14 cm  LEFT ATRIUM         Index LA diam:    2.60 cm 1.28 cm/m                        PULMONIC VALVE AORTA  PV Vmax:       0.76 m/s Ao Root diam: 2.45 cm PV Peak grad:  2.3 mmHg  TRICUSPID VALVE TR Peak grad:   15.4 mmHg TR Vmax:        196.00 cm/s  SHUNTS Systemic Diam: 2.00 cm Ida Rogue MD Electronically signed by Ida Rogue MD Signature Date/Time: 01-18-22/9:58:30 AM    Final    Rapid EEG  Result Date: 01/18/2022 Lora Havens, MD     01/18/22  1:41 PM Patient Name: STEPHANNY TSUTSUI MRN: [id number] Epilepsy Attending: Lora Havens Referring Physician/Provider: Lorenza Chick, MD Duration: 12/09/2021 1409 to Jan 18, 2022 1205  Patient history: 51yoF s/p cardiac arrest. EEG to evaluate for seizure.  Level of alertness: comatose  AEDs during EEG study: LEV, versed  Technical aspects: This EEG was obtained using a 10 lead EEG system positioned circumferentially without any parasagittal coverage (rapid EEG). Computer selected EEG is reviewed as  well as background features and all clinically significant events. Description: At the beginning of study, patient was noted to have episodes of brief sudden eye opening every 30-40 seconds.  Concomitant EEG showed generalized polyspikes consistent with myoclonic seizures lasting 2.5-3 seconds.  In between seizures EEG showed generalized background suppression. Gradually as medications were adjusted after around 1900, EEG showed generalized background attenuation. Hyperventilation and photic stimulation were not performed.    ABNORMALITY - Myoclonic  seizure, generalized - Background attenuation, generalized  IMPRESSION: At the beginning of study, patient was noted to have myoclonic seizures lasting 2.5-3 seconds every 30-40 seconds.  Gradually as medications were adjusted after  around 1700, the study was suggestive of profound diffuse encephalopathy. In the setting of cardiac arrest, this EEG pattern is suggestive of anoxic/hypoxic brain injury.  Lora Havens   MR BRAIN W WO CONTRAST  Result Date: 01-18-22 CLINICAL DATA:  Initial evaluation for acute seizure. History of cardiac arrest. EXAM: MRI HEAD WITHOUT AND WITH CONTRAST TECHNIQUE: Multiplanar, multiecho pulse sequences of the brain and surrounding structures were obtained without and with intravenous contrast. CONTRAST:  74m GADAVIST GADOBUTROL 1 MMOL/ML IV SOLN COMPARISON:  Prior CT from earlier the same day. FINDINGS: Brain: There is extensive abnormal T2/FLAIR and diffusion signal abnormality involving the cortical gray matter of the bilateral cerebral hemispheres. Involvement is somewhat patchy but fairly symmetric, and is most pronounced at the parieto-occipital and temporal lobes. Symmetric signal abnormality seen involving the caudate and lentiform nuclei. Extensive signal changes seen throughout the cerebellum. Imaging findings are most consistent with severe anoxic brain injury. Associated swelling and edema with loss of cortical sulcation. Ventricular size is somewhat decreased secondary to edema. Basilar cisterns are largely effaced. Evidence for transtentorial herniation at the foramen magnum with the cerebellar tonsils herniating up to approximately 1.7 cm below the foramen magnum (series 13, image 12). No definite acute vascular infarct. No areas of chronic cortical infarction. No acute or chronic intracranial blood products. No mass lesion or significant midline shift at this time. No extra-axial fluid collection. Pituitary gland suprasellar region grossly normal. No other  intrinsic temporal lobe abnormality. Little if any arterial enhancement seen within the brain following contrast administration. Vascular: Loss of normal flow voids within the internal carotid arteries bilaterally, which could be related to slow flow and/or occlusion. Findings likely related to global cerebral edema and elevated intracranial pressure. The absence of arterial enhancement following contrast administration is consistent with this finding. Skull and upper cervical spine: Transtentorial herniation at the skull base as above. Bone marrow signal  intensity within normal limits. No scalp soft tissue abnormality. Sinuses/Orbits: Globes orbital soft tissues within normal limits. Scattered mucosal thickening noted throughout the paranasal sinuses with small air-fluid levels within the sphenoid sinuses. Patient is intubated. Other: None. IMPRESSION: 1. Findings consistent with severe anoxic brain injury as above. Secondary diffuse cerebral and cerebellar edema with basilar cistern effacement and transtentorial herniation at the foramen magnum. 2. Loss of normal flow voids within the internal carotid arteries bilaterally, suggesting slow flow and/or occlusion. This is likely related to global cerebral edema and elevated intracranial pressure. Little if any arterial contrast enhancement seen within the brain following contrast administration. Critical Value/emergent results were called by telephone at the time of interpretation on 2021/12/27 at 12:25 a.m. to provider Rufina Falco. Electronically Signed   By: Jeannine Boga M.D.   On: 12/27/21 01:35   CT HEAD WO CONTRAST (5MM)  Result Date: 12/30/2021 CLINICAL DATA:  Seizure, new-onset, no history of trauma Mental status change, unknown cause EXAM: CT HEAD WITHOUT CONTRAST TECHNIQUE: Contiguous axial images were obtained from the base of the skull through the vertex without intravenous contrast. RADIATION DOSE REDUCTION: This exam was performed  according to the departmental dose-optimization program which includes automated exposure control, adjustment of the mA and/or kV according to patient size and/or use of iterative reconstruction technique. COMPARISON:  MRI head 10/15/2020 FINDINGS: Brain: Bilateral occipital, parietal, temporal, and cerebellar hypodensity/vasogenic edema of the subcortical white matter. No evidence of large-territorial acute infarction. No parenchymal hemorrhage. No mass lesion. No extra-axial collection. Edema of the cerebellar tonsils with encroachment on the foramen magnum. Loss of the affected cerebral sulci and cerebellar fissures. No midline shift. No hydrocephalus. Basilar cisterns are patent. Vascular: No hyperdense vessel. Skull: No acute fracture or focal lesion. Sinuses/Orbits: Paranasal sinuses and mastoid air cells are clear. The orbits are unremarkable. Other: None. IMPRESSION: Bilateral occipital, parietal, temporal, and cerebellar hypodensity/vasogenic edema of the subcortical white matter. Findings suggestive of possible severe PRES. Recommend MRI for further evaluation. These results will be called to the ordering clinician or representative by the Radiologist Assistant, and communication documented in the PACS or [employer]. Electronically Signed   By: Iven Finn M.D.   On: 12/04/2021 17:36   DG Chest Port 1 View  Result Date: 12/10/2021 CLINICAL DATA:  Central line placement EXAM: PORTABLE CHEST 1 VIEW COMPARISON:  Chest 01/01/2022 FINDINGS: Left jugular central venous catheter in the mid SVC in good position. No pneumothorax. Endotracheal tube in good position. NG tube in the stomach Asymmetric airspace disease on the right similar to earlier today. No significant pleural effusion. IMPRESSION: Satisfactory central line placement Asymmetric airspace disease on the right unchanged. Electronically Signed   By: Franchot Gallo M.D.   On: 12/22/2021 16:11   DG Chest Port 1 View  Result Date:  12/12/2021 CLINICAL DATA:  Intubated, gastric tube placed EXAM: PORTABLE CHEST 1 VIEW COMPARISON:  Prior chest x-ray 04/28/2020 FINDINGS: The patient has been intubated. The endotracheal tube is 4.3 cm above the carina. A gastric tube is been placed. The tip of the tube overlies the gastric fundus. Cardiac and mediastinal contours are within normal limits. Nonspecific asymmetric patchy airspace opacity in the right mid lung. Probable bibasilar atelectasis. Overall inspiratory volumes are low. Pulmonary vascular congestion without overt edema. IMPRESSION: 1. Well-positioned endotracheal tube and gastric tube. 2. Low inspiratory volumes with nonspecific central right patchy airspace opacity concerning for possible aspiration given recent history of cardiac arrest. Electronically Signed   By: Dellis Filbert.D.  On: 12/07/2021 14:34   EEG adult  Result Date: 12/20/2021 Lora Havens, MD     01-07-2022  8:37 AM Patient Name: TAYTE CHILDERS MRN: [id number] Epilepsy Attending: Lora Havens Referring Physician/Provider: Teressa Lower, NP Date: 12/29/2021 Duration: 26.10 mins Patient history: 51yoF s/p cardiac arrest. EEG to evaluate for seizure. Level of alertness: comatose AEDs during EEG study: None Technical aspects: This EEG study was done with scalp electrodes positioned according to the 10-20 International system of electrode placement. Electrical activity was reviewed with band pass filter of 1-'70Hz'$ , sensitivity of 7 uV/mm, display speed of 65m/sec with a '60Hz'$  notched filter applied as appropriate. EEG data were recorded continuously and digitally stored.  Video monitoring was available and reviewed as appropriate. Description: Patient was noted to have episodes of brief sudden eye opening every 30-40 seconds .  Concomitant EEG showed generalized polyspikes consistent with myoclonic seizures lasting 2.5-3 seconds.  In between seizures EEG showed generalized background suppression.  Hyperventilation and photic stimulation were not performed.   ABNORMALITY - Myoclonic seizure, generalized - Background suppression, generalized IMPRESSION: Patient was noted to have myoclonic seizures lasting 2.5-3 seconds every 30-40 seconds.  Additionally there was evidence of profound diffuse encephalopathy.  In the setting of cardiac arrest, this EEG pattern is suggestive of anoxic/hypoxic brain injury. Dr. KMortimer Frieswas notified. PLora Havens   Microbiology Recent Results (from the past 240 hour(s))  SARS Coronavirus 2 by RT PCR (hospital order[facility]) *cepheid single result test* Anterior Nasal Swab     Status: None   Collection Time: 12/22/2021 11:41 AM   Specimen: Anterior Nasal Swab  Result Value Ref Range Status   SARS Coronavirus 2 by RT PCR NEGATIVE NEGATIVE Final    Comment: (NOTE) SARS-CoV-2 target nucleic acids are NOT DETECTED.  The SARS-CoV-2 RNA is generally detectable in upper and lower respiratory specimens during the acute phase of infection. The lowest concentration of SARS-CoV-2 viral copies this assay can detect is 250 copies / mL. A negative result does not preclude SARS-CoV-2 infection and should not be used as the sole basis for treatment or other patient management decisions.  A negative result may occur with improper specimen collection / handling, submission of specimen other than nasopharyngeal swab, presence of viral mutation(s) within the areas targeted by this assay, and inadequate number of viral copies (<250 copies / mL). A negative result must be combined with clinical observations, patient history, and epidemiological information.  Fact Sheet for Patients:   hhttps://www.patel.info/ Fact Sheet for Healthcare Providers: hhttps://hall.com/ This test is not yet approved or  cleared by the [place] and has been authorized for detection and/or diagnosis of SARS-CoV-2  by FDA under an Emergency Use Authorization (EUA).  This EUA will remain in effect (meaning this test can be used) for the duration of the COVID-19 declaration under Section 564(b)(1) of the Act, 21 U.S.C. section 360bbb-3(b)(1), unless the authorization is terminated or revoked sooner.  [facility] [address], [place] [id number]    Lab Basic Metabolic Panel: Recent Labs  Lab 12/17/2021 0800 12/04/2021 1348 12/09/2021 1545 12/29/2021 1814 12/07/2021 1934 0Oct 06, [id number] [id number]/[id number]/06/[id number] 010/06/[id number]  NA 139  --  136  --   --   --   --   --  130*  K 6.2*   < > 2.2*  3.1* 2.8* 3.2* 3.6 3.6  --  2.5*  CL 105  --  109  --   --   --   --   --  98  CO2 18*  --  23  --   --   --   --   --  25  GLUCOSE 363*  --  218*  --   --   --   --   --  245*  BUN 18  --  18  --   --   --   --   --  13  CREATININE 1.29*  --  0.84  --   --   --   --   --  1.11*  CALCIUM 8.4*  --  5.7*  --   --   --   --   --  7.1*  MG 2.7*  --   --   --   --   --   --  1.3* 1.2*  PHOS  --   --  2.4*  --  1.8*  --   --  1.2* 1.2*   < > = values in this interval not displayed.   Liver Function Tests: Recent Labs  Lab 12/12/2021 0800 12/04/2021 1545 12/30/2021 1153  AST 376*  --  421*  ALT 317*  --  588*  ALKPHOS 53  --  66  BILITOT 0.9  --  0.8  PROT 5.2*  --  5.1*  ALBUMIN 2.8* 2.2* 2.7*   No results for input(s): "LIPASE", "AMYLASE" in the last 168 hours. No results for input(s): "AMMONIA" in the last 168 hours. CBC: Recent Labs  Lab 12/09/2021 0800  WBC 9.2  NEUTROABS 4.0  HGB 12.3  HCT 38.4  MCV 105.8*  PLT 219   Cardiac Enzymes: No results for input(s): "CKTOTAL", "CKMB", "CKMBINDEX", "TROPONINI" in the last 168 hours. Sepsis Labs: Recent Labs  Lab 12/18/2021 0800 12/06/2021 1348 12/15/2021 1814  WBC 9.2  --   --   LATICACIDVEN  --  6.7* 3.4*    Procedures/Operations  9/21: Intubation 9/21: Left IJ CVC placed    Darel Hong, AGACNP-BC [place]  Pulmonary & [place] epic messenger for cross cover needs If after hours, please call E-link  Bradly Bienenstock December 30, 2021, 3:14 PM

## 2022-01-02 NOTE — Progress Notes (Signed)
NAME:  Latoya [name], MRN:  [id number], DOB:  11-May-1970, LOS: 1 ADMISSION DATE:  12/03/2021  BRIEF SYNOPSIS ACUTE DRUG OD AND SUICIDE ATTEMPT RESULING IN CARDIAC ARREST  History of Present Illness:  51 y.o. female who comes in with cardiac arrest.   Patient was noted to be acting very bizarre at home thrashing around there was concern  overdose.   When she got in the EMS truck she lost pulses.   They coded patient for 30 minutes and gave 1 mg of glucagon  +beta-blocker overdose although she might of taken some of her husbands amiodarone as well.    Patient came in unresponsive pupils dilated and nonreactive but unclear if there was any other medications.  Patient subsequently had LUCAS device in place and had an additional cardiac arrest in ER for 15 mins  NEUROLOGICAL EXAMINATION IS [facility]?[place]  Patient supposedly has taken indomethacin, extended release Beta blocker toxicity and poisoning  10-Jan-2022- family in room visiting patient. [facility] staff came in to speak with [place] regarding donation   Pertinent  Medical History   Active Ambulatory Problems    Diagnosis Date Noted   Anxiety 05/26/2015   Clinical depression 05/26/2015   Pulmonary embolism ([place]) 05/26/2015   Need for lipid screening 09/01/2020   Annual physical exam 09/01/2020   Attention deficit hyperactivity disorder (ADHD), combined type 12/15/2020   Acute hemorrhagic otitis externa of right ear 09/15/2021   Dyslipidemia 09/22/2021   Intentional overdose ([place])    Resolved Ambulatory Problems    Diagnosis Date Noted   No Resolved Ambulatory Problems   Past Medical History:  Diagnosis Date   Condyloma acuminata    Depression    Hematuria    PE (pulmonary embolism)      Significant Hospital Events: Including procedures, antibiotic start and stop dates in addition to other pertinent events   9/21 acute cardiac arrest and resp failure, beta blocker poisoning      Objective   Blood  pressure (!) 121/101, pulse 73, temperature 99.1 F (37.3 C), resp. rate (!) 24, height '5\' 7"'$  (1.702 m), weight 92 kg, SpO2 100 %.    Vent Mode: PRVC FiO2 (%):  [50 %-100 %] 50 % Set Rate:  [24 bmp] 24 bmp Vt Set:  [500 mL] 500 mL PEEP:  [10 cmH20] 10 cmH20 Plateau Pressure:  [21 cmH20-22 cmH20] 21 cmH20   Intake/Output Summary (Last 24 hours) at 2022-01-10 0842 Last data filed at 2022-01-10 0800 Gross per 24 hour  Intake 9288.44 ml  Output 3590 ml  Net 5698.44 ml   Filed Weights   12/04/2021 0757 01/10/22 0308  Weight: 77.1 kg 92 kg      REVIEW OF SYSTEMS  PATIENT IS UNABLE TO PROVIDE COMPLETE REVIEW OF SYSTEMS DUE TO SEVERE CRITICAL ILLNESS AND  METABOLIC ENCEPHALOPATHY   PHYSICAL EXAMINATION:  GENERAL:critically ill appearing, +resp distress EYES: Pupils equal, round, NOT reactive to light.  No scleral icterus.  MOUTH: Moist mucosal membrane. INTUBATED NECK: Supple.  PULMONARY: +rhonchi, +wheezing CARDIOVASCULAR: S1 and S2.  No murmurs  GASTROINTESTINAL: Soft, nontender, -distended. Positive bowel sounds.  MUSCULOSKELETAL: No swelling, clubbing, or edema.  NEUROLOGIC: obtunded, no corneal relfex, no gag reflex, oculocephalic reflex absent SKIN:mottled    Labs/imaging that I havepersonally reviewed  (right click and "Reselect all SmartList Selections" daily)      ASSESSMENT AND PLAN SYNOPSIS  51 yo white female with acute beta blocker poisoning resulting in sudden cardiac death with severe hypoxic resp  failure leading to CODE BLUE/ACLS protocol now with signs and symptoms of severe brain damage, with PROLONGED CARDIAC ARREST, PATIENT IS NOT AN IDEAL CANDIDATE FOR NORMOTHERMIA PROTOCOL   Severe ACUTE Hypoxic and Hypercapnic Respiratory Failure -continue Mechanical Ventilator support -continue Bronchodilator Therapy -Wean Fio2 and PEEP as tolerated -VAP/VENT bundle implementation -will NOT perform SAT/SBT   Vent Mode: PRVC FiO2 (%):  [50 %-100 %] 50 % Set  Rate:  [24 bmp] 24 bmp Vt Set:  [500 mL] 500 mL PEEP:  [10 cmH20] 10 cmH20 Plateau Pressure:  [21 cmH20-22 cmH20] 21 cmH20    CARDIAC FAILURE-beta blocker toxicity CARDIAC SHOCK Pressors as needed   CARDIAC ICU monitoring    NEUROLOGY Acute  metabolic encephalopathy Signs of brain damage Avoid sedation Consider CT/MRI/NEUROLOGY   ENDO - ICU hypoglycemic\Hyperglycemia protocol -check FSBS per protocol   GI GI PROPHYLAXIS as indicated  NUTRITIONAL STATUS DIET-->NPO Constipation protocol as indicated   ELECTROLYTES -follow labs as needed -replace as needed -pharmacy consultation and following   ACUTE ANEMIA- TRANSFUSE AS NEEDED CONSIDER TRANSFUSION  IF HGB<7      Best practice (right click and "Reselect all SmartList Selections" daily)  Diet: NPO VAP protocol (if indicated): Yes DVT prophylaxis: Subcutaneous Heparin GI prophylaxis: PPI Mobility:  bed rest  Code Status:  FULL Disposition:ICU  Labs   CBC: Recent Labs  Lab 12/13/2021 0800  WBC 9.2  NEUTROABS 4.0  HGB 12.3  HCT 38.4  MCV 105.8*  PLT 219     Basic Metabolic Panel: Recent Labs  Lab 12/04/2021 0800 12/15/2021 1348 12/30/2021 1545 12/03/2021 1814 12/21/2021 1934 01-16-22 0021 01-16-22 0358 16-Jan-2022 0359  NA 139  --  136  --   --   --   --   --   K 6.2*   < > 2.2*  3.1* 2.8* 3.2* 3.6 3.6  --   CL 105  --  109  --   --   --   --   --   CO2 18*  --  23  --   --   --   --   --   GLUCOSE 363*  --  218*  --   --   --   --   --   BUN 18  --  18  --   --   --   --   --   CREATININE 1.29*  --  0.84  --   --   --   --   --   CALCIUM 8.4*  --  5.7*  --   --   --   --   --   MG 2.7*  --   --   --   --   --   --  1.3*  PHOS  --   --  2.4*  --  1.8*  --   --  1.2*   < > = values in this interval not displayed.    GFR: Estimated Creatinine Clearance: 91.3 mL/min (by C-G formula based on SCr of 0.84 mg/dL). Recent Labs  Lab 12/16/2021 0800 12/11/2021 1348 12/10/2021 1814  WBC 9.2  --   --    LATICACIDVEN  --  6.7* 3.4*     Liver Function Tests: Recent Labs  Lab 12/13/2021 0800 12/26/2021 1545  AST 376*  --   ALT 317*  --   ALKPHOS 53  --   BILITOT 0.9  --   PROT 5.2*  --   ALBUMIN 2.8* 2.2*  No results for input(s): "LIPASE", "AMYLASE" in the last 168 hours. No results for input(s): "AMMONIA" in the last 168 hours.  ABG    Component Value Date/Time   PHART 7.07 (LL) 12/31/2021 1044   PCO2ART 79 (HH) 12/07/2021 1044   PO2ART 75 (L) 12/28/2021 1044   HCO3 27.9 12/15/2021 1641   ACIDBASEDEF 8.8 (H) 12/27/2021 1044   O2SAT 81.2 12/20/2021 1641     Coagulation Profile: Recent Labs  Lab 12/06/2021 1545  INR 1.7*    Cardiac Enzymes: No results for input(s): "CKTOTAL", "CKMB", "CKMBINDEX", "TROPONINI" in the last 168 hours.  HbA1C: No results found for: "HGBA1C"  CBG: Recent Labs  Lab 04-Jan-2022 0629 2022/01/04 0656 Jan 04, 2022 0733 2022-01-04 0803 01/04/22 0831  GLUCAP 209* 204* 213* 174* 145*      Past Medical History:  She,  has a past medical history of Anxiety, Condyloma acuminata, Depression, Hematuria, and PE (pulmonary embolism).   Surgical History:   Past Surgical History:  Procedure Laterality Date   ABDOMINAL HYSTERECTOMY     ABLATION ON ENDOMETRIOSIS  04/04/2005   COLONOSCOPY WITH PROPOFOL N/A 08/09/2016   Procedure: COLONOSCOPY WITH PROPOFOL;  Surgeon: Jonathon Bellows, MD;  [facility] ENDOSCOPY;  Service: Endoscopy;  Laterality: N/A;   RIGHT OOPHORECTOMY     SALPINGECTOMY Left    TOTAL ABDOMINAL HYSTERECTOMY  2016   Surgical Pathology completed in 2016   TUBAL LIGATION  04/04/1988     Social History:   reports that she has been smoking e-cigarettes. She has never used smokeless tobacco. She reports current alcohol use of about 21.0 standard drinks of alcohol per week. She reports that she does not use drugs.   Family History:  Her family history includes Lymphoma in her brother and father; Multiple sclerosis in her sister; Pulmonary  embolism in her mother.   Allergies Allergies  Allergen Reactions   Biaxin [Clarithromycin] Nausea Only     Home Medications  Prior to Admission medications   Medication Sig Start Date End Date Taking? Authorizing Provider  lisdexamfetamine (VYVANSE) 50 MG capsule Take 1 capsule (50 mg total) by mouth daily. 12/23/15   Shambley, Melody N, CNM  ofloxacin (FLOXIN OTIC) 0.3 % OTIC solution Place 5 drops into the right ear 2 (two) times daily. 09/15/21   Cletis Athens, MD  propranolol ER (INDERAL LA) 80 MG 24 hr capsule Take 1 capsule (80 mg total) by mouth daily. 05/24/21   Cletis Athens, MD  rivaroxaban (XARELTO) 20 MG TABS tablet Take 1 tablet (20 mg total) by mouth daily. 05/24/21   Cletis Athens, MD  rosuvastatin (CRESTOR) 20 MG tablet Take 1 tablet (20 mg total) by mouth daily. 09/22/21   Cletis Athens, MD  Venlafaxine HCl 225 MG TB24 Take 1 tablet by mouth once daily 12/20/21   Cletis Athens, MD       DVT/GI PRX  assessed I Assessed the need for Labs I Assessed the need for Foley I Assessed the need for Central Venous Line Family Discussion when available I Assessed the need for Mobilization I made an Assessment of medications to be adjusted accordingly Safety Risk assessment completed  CASE DISCUSSED IN MULTIDISCIPLINARY ROUNDS WITH ICU TEAM     Critical Care Time devoted to patient care services described in this note is 75 minutes.   Critical care was necessary to treat /prevent imminent and life-threatening deterioration.   Critical care provider statement:   Total critical care time: 33 minutes   Performed by: Lanney Gins MD   Critical care  time was exclusive of separately billable procedures and treating other patients.   Critical care was necessary to treat or prevent imminent or life-threatening deterioration.   Critical care was time spent personally by me on the following activities: development of treatment plan with patient and/or surrogate as well as nursing,  discussions with consultants, evaluation of patient's response to treatment, examination of patient, obtaining history from patient or surrogate, ordering and performing treatments and interventions, ordering and review of laboratory studies, ordering and review of radiographic studies, pulse oximetry and re-evaluation of patient's condition.    Ottie Glazier, M.D.  Pulmonary & Critical Care Medicine

## 2022-01-02 NOTE — Progress Notes (Signed)
Corinne Ports (contact # [phone]) from [facility] called and stated that this patient is a potential organ donor candidate. Plan is for a representative to meet with family ~8 AM to discuss potential organ donation before transition to comfort care.   Cameron Ali, RN

## 2022-01-02 NOTE — Progress Notes (Signed)
   Dec 29, 2021 1000  Clinical Encounter Type  Visited With Patient and family together  Visit Type Follow-up  Referral From Chaplain  Consult/Referral To [place] followed up with family to provide on-going support after on-call chaplain.

## 2022-01-02 NOTE — Progress Notes (Signed)
*  PRELIMINARY RESULTS* Echocardiogram 2D Echocardiogram has been performed.  Latoya [name] 15-Jan-2022, 7:52 AM

## 2022-01-02 NOTE — Progress Notes (Signed)
Family has decided to forgo organ donation since the patient is not a registered donor on her own terms. The husband would like to transition to comfort care.   Extubation occurred at 1254 with time of death at 1303. The patient was surrounded by family and visited throughout the day by friends and family. She passed peacefully without any sign of distress.

## 2022-01-02 NOTE — Progress Notes (Signed)
   2022-01-14 1300  Clinical Encounter Type  Visited With Patient and family together  Visit Type Follow-up;Death  Spiritual Encounters  Spiritual Needs Grief support   Chaplain available to family at the death of loved one.

## 2022-01-02 NOTE — Progress Notes (Signed)
Patient is a medical examiner case, all PIVs, central line and foley left intact.   Family left before this RN could get funeral home information.

## 2022-01-02 NOTE — IPAL (Addendum)
  Interdisciplinary Goals of Care Family Meeting   Interdisciplinary Goals of Care Family Meeting     Date carried out: 2021-12-30 Location of the meeting: Conference Room   Member's involved: NP and Family Member or next of kin: Husband, Daughters, sister in Ship broker Power of Attorney or Loss adjuster, chartered:    Discussion:  Advance Care Planning/Goals of Care discussion was performed during the course of treatment to decide on type of care right for this patient following admission to the ICU.   I met with patient's husband, sister, daughter and other immediate family members to discuss goals of care in details  regarding MRI Brain finding. I reviewed findings of the MRI brain which is consistent with severe anoxic brain injury. There is also secondary diffuse cerebral and cerebellar edema with basilar cistern effacement and transtentorial herniation at the foramen magnum.Loss of normal flow voids within the internal carotid arteries bilaterally, suggesting slow flow and/or occlusion. This is likely related to global cerebral edema and elevated intracranial pressure.  Reviewed patient's worsening lab, ABGs, vital signs including unstable HR and blood pressure and overall poor prognosis with the family at the bedside and answered all their question. I discussed neuro exam as below.  FOCUSED NEURO EXAM Patient is currently intubated, mechanically ventilated and sedated. Sedation was held briefly for focused Neuro Exam. Patient does not respond to verbal stimuli.  Does not respond to deep sternal rub.  Does not follow commands.  No verbalizations noted. Cranial Nerves: II: patient does not respond confrontation bilaterally, pupils right 4 mm, left 4 mm,and non-reactive bilaterally III,IV,VI: doll's response absent bilaterally. V,VII: corneal reflex absent bilaterally  VIII: patient does not respond to verbal stimuli IX,X: gag reflex absent, XI: trapezius strength unable to test  bilaterally XII: tongue strength unable to test Motor: Extremities flaccid throughout.  No spontaneous movement noted.  No purposeful movements noted. Sensory: Does not respond to noxious stimuli in any extremity. Deep Tendon Reflexes: Absent throughout. Plantars: absent bilaterally Cerebellar: Unable to perform  Discussed prognosis, expected outcome with or without ongoing aggressive treatments and the options for de-escalation of care.   Diagnosis(es): PEA Cardiac Arrest with Cardiogenic and Circulatory shock, Acute hypoxic hypercapnic respiratory failure secondary to intentional Drug Overdose,  Anoxic Brain Injury post cardiac arrest, Multiorgan Failure, Prognosis: Poor Code Status: DNR Disposition: ICU Next Steps:  Family understands the situation. They have consented and agreed to DNR/DNI and would not wish to pursue any aggressive treatment.  Patient's family would like to proceed with full comfort care including terminal extubation.    Family are satisfied with Plan of action and management. All questions answered    Total Time Spent Face to Face addressing advance care planning in the presence of the Patient: 35 minutes       Rufina Falco, DNP, FNP-C, AGACNP-BC Acute Care Nurse Practitioner [place] [facility] Pager: [phone] [place] at [facility]

## 2022-01-02 NOTE — Procedures (Addendum)
Patient Name: Latoya [name]  MRN: [id number]  Epilepsy Attending: Lora Havens  Referring Physician/Provider: Lorenza Chick, MD  Duration: 12/22/2021 1409 to Jan 21, 2022 1205   Patient history: 76yoF s/p cardiac arrest. EEG to evaluate for seizure.   Level of alertness: comatose   AEDs during EEG study: LEV, versed   Technical aspects: This EEG was obtained using a 10 lead EEG system positioned circumferentially without any parasagittal coverage (rapid EEG). Computer selected EEG is reviewed as  well as background features and all clinically significant events.  Description: At the beginning of study, patient was noted to have episodes of brief sudden eye opening every 30-40 seconds.  Concomitant EEG showed generalized polyspikes consistent with myoclonic seizures lasting 2.5-3 seconds.  In between seizures EEG showed generalized background suppression. Gradually as medications were adjusted after around 1900, EEG showed generalized background attenuation. Hyperventilation and photic stimulation were not performed.      ABNORMALITY - Myoclonic seizure, generalized - Background attenuation, generalized   IMPRESSION: At the beginning of study, patient was noted to have myoclonic seizures lasting 2.5-3 seconds every 30-40 seconds.  Gradually as medications were adjusted after  around 1700, the study was suggestive of profound diffuse encephalopathy. In the setting of cardiac arrest, this EEG pattern is suggestive of anoxic/hypoxic brain injury.    Iran Rowe Barbra Sarks

## 2022-01-02 NOTE — Progress Notes (Signed)
[facility] updated about family's plan to procedure with comfort care ~9am in the morning.

## 2022-01-02 NOTE — Progress Notes (Signed)
Patient extubated to comfort care per MD orders @ 12:54.

## 2022-01-02 NOTE — Progress Notes (Addendum)
Daily Progress Note   Patient Name: Latoya [name]       Date: 01/20/22 DOB: 19-Dec-1970  Age: 51 y.o. MRN#: [id number] Attending Physician: Ottie Glazier, MD Primary Care Physician: Cletis Athens, MD Admit Date: 12/26/2021  Reason for Consultation/Follow-up: Establishing goals of care  Subjective: Notes, labs, diagnostics reviewed.  Patient's EEG and MRI reviewed.  In to see patient.  She is resting in bed on ventilator.  Many visitors/family members at bedside.  Stepped to [place] with patient's husband and her daughter.  Discussed her status and updates.  Family is discussing organ donation and comfort care.  Husband has reservations with the plan and timeline/process for organ donation.  He discusses his wife's history in great detail; he is understandably struggling to process the entire situation and all of the most recent events.  Patient's daughter discusses her relationship with her mother as well.  Support offered to them both and discussion of goals of care had.  Patient's husband shares his guilt over her current status.  Chaplain entered into conversation as well.  At conclusion of conversation, husband has decided to send family members to look at the patient's driver's license to see if she is an organ donor.  I was later updated by nursing that patient is not a known organ donor and so the family does not intend to proceed with organ donation.     Plans for comfort care and hospital death when husband and family members are ready.  Length of Stay: 1  Current Medications: Scheduled Meds:   Chlorhexidine Gluconate Cloth  6 each Topical Daily   heparin injection (subcutaneous)  5,000 Units Subcutaneous Q8H   mouth rinse  15 mL Mouth Rinse Q2H   pantoprazole (PROTONIX) IV  40  mg Intravenous Q12H    Continuous Infusions:  sodium chloride 10 mL/hr at 2022-01-20 1200   sodium chloride     dextrose 150 mL/hr at 20-Jan-2022 1200   DOPamine 5 mcg/kg/min (2022-01-20 1200)   epinephrine 6 mcg/min (01/20/2022 1200)   insulin HIGH DOSE (4 units/mL) infusion for beta blocker/calcium channel blocker overdose 3 Units/kg/hr (01/20/2022 1200)   levETIRAcetam Stopped (01-20-22 0116)   midazolam 6 mg/hr (01-20-22 1200)   morphine     norepinephrine (LEVOPHED) Adult infusion 7 mcg/min (2022-01-20 1105)    PRN Meds: acetaminophen **OR**  acetaminophen, dextrose, glycopyrrolate **OR** glycopyrrolate **OR** glycopyrrolate, haloperidol lactate, morphine, mouth rinse, polyvinyl alcohol  Physical Exam Constitutional:      Comments: Eyes closed.  Pulmonary:     Comments: On ventilator support.            Vital Signs: BP 129/89   Pulse 73   Temp 98.1 F (36.7 C)   Resp (!) 24   Ht '5\' 7"'$  (1.702 m)   Wt 92 kg   SpO2 100%   BMI 31.77 kg/m  SpO2: SpO2: 100 % O2 Device: O2 Device: Ventilator O2 Flow Rate:    Intake/output summary:  Intake/Output Summary (Last 24 hours) at 12-Jan-2022 1233 Last data filed at 01/12/2022 1200 Gross per 24 hour  Intake 10237.96 ml  Output 4860 ml  Net 5377.96 ml   LBM: Last BM Date :  (PTA) Baseline Weight: Weight: 77.1 kg Most recent weight: Weight: 92 kg   Patient Active Problem List   Diagnosis Date Noted   Seizure ([place])    Drug overdose 12/12/2021   Intentional overdose ([place])    Bradycardia    Cardiac arrest ([facility])    Hypotension due to drugs    Dyslipidemia 09/22/2021   Acute hemorrhagic otitis externa of right ear 09/15/2021   Attention deficit hyperactivity disorder (ADHD), combined type 12/15/2020   Need for lipid screening 09/01/2020   Annual physical exam 09/01/2020   Anxiety 05/26/2015   Clinical depression 05/26/2015   Pulmonary embolism ([place]) 05/26/2015    Palliative Care Assessment & Plan   Recommendations/Plan: Shift  to comfort focused care when family is ready with hospital death.    Code Status:    Code Status Orders  (From admission, onward)           Start     Ordered   2022/01/12 1227  DNR (Do not attempt resuscitation)  Continuous       Question Answer Comment  In the event of cardiac or respiratory ARREST Do not call a "code blue"   In the event of cardiac or respiratory ARREST Do not perform Intubation, CPR, defibrillation or ACLS   In the event of cardiac or respiratory ARREST Use medication by any route, position, wound care, and other measures to relive pain and suffering. May use oxygen, suction and manual treatment of airway obstruction as needed for comfort.   Comments No care in connection with CPR      01/12/22 1228           Code Status History     Date Active Date Inactive Code Status Order ID Comments User Context   01-12-22 0422 2022-01-12 1228 DNR [id number]  Lang Snow, NP Inpatient   12/30/2021 1512 2022-01-12 0422 Partial Code [id number]  Asencion Gowda, NP Inpatient   12/06/2021 0859 01/01/2022 1512 Full Code [id number]  Teressa Lower, NP ED      Care plan was discussed with nursing staff  Thank you for allowing the Palliative Medicine Team to assist in the care of this patient.   Asencion Gowda, NP  Please contact Palliative Medicine Team phone at [phone] for questions and concerns.

## 2022-01-02 DEATH — deceased

## 2023-02-01 IMAGING — MR MR HEAD W/O CM
9 of 17 series · 24 of 48 positions shown · IV contrast (multihance)
Comparison: None available.

CLINICAL DATA: Initial evaluation for headache with sexual activity
since Tuesday June, 2020.

EXAM:
MRI HEAD WITHOUT CONTRAST
MRA HEAD WITHOUT CONTRAST
MRA NECK WITHOUT AND WITH CONTRAST
TECHNIQUE: Multiplanar, multi-echo pulse sequences of the brain and surrounding
structures were acquired without intravenous contrast. Angiographic
images of the Circle of Willis were acquired using MRA technique
without intravenous contrast. Angiographic images of the neck were
acquired using MRA technique without and with intravenous contrast.
Carotid stenosis measurements (when applicable) are obtained
utilizing NASCET criteria, using the distal internal carotid
diameter as the denominator.
CONTRAST:  15mL MULTIHANCE GADOBENATE DIMEGLUMINE 529 MG/ML IV SOLN

[Series 5: T1 · sagittal · 4.0mm · 0.75mm/px · 1 of 31 slices shown (1 of 2)]
[im 1/31]
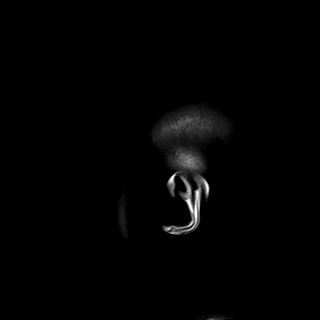

[Series 9: [id number] · axial · 0.6mm · 0.26mm/px · z∈[-34,+42]mm · 5 of 162 slices shown]
[im 1/162]
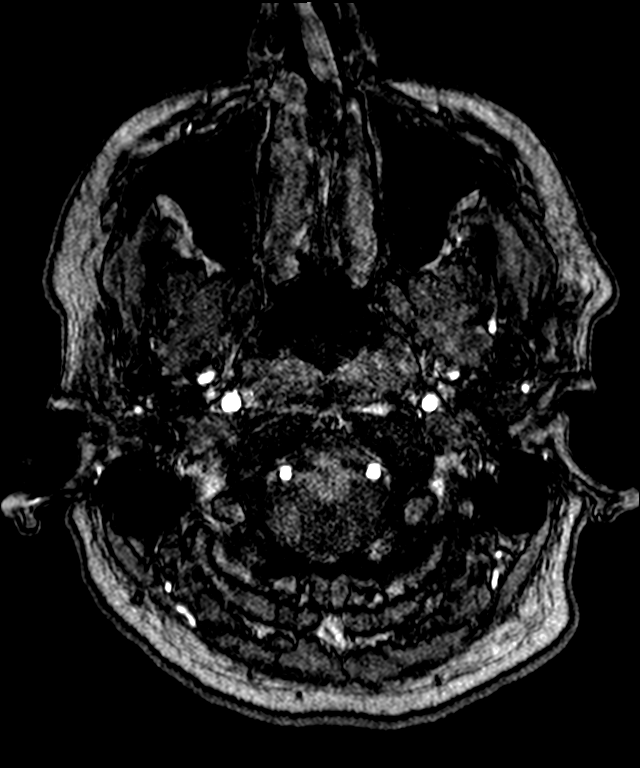
[im 33/162]
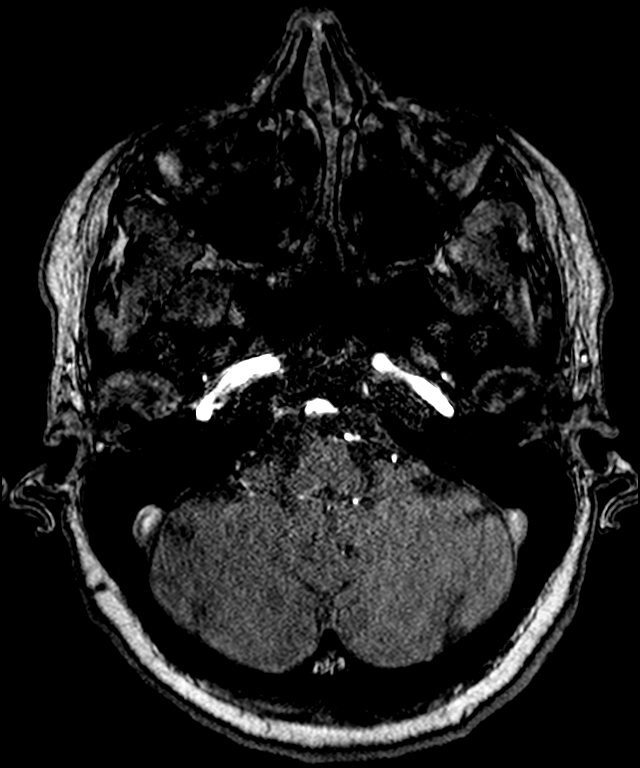
[im 65/162]
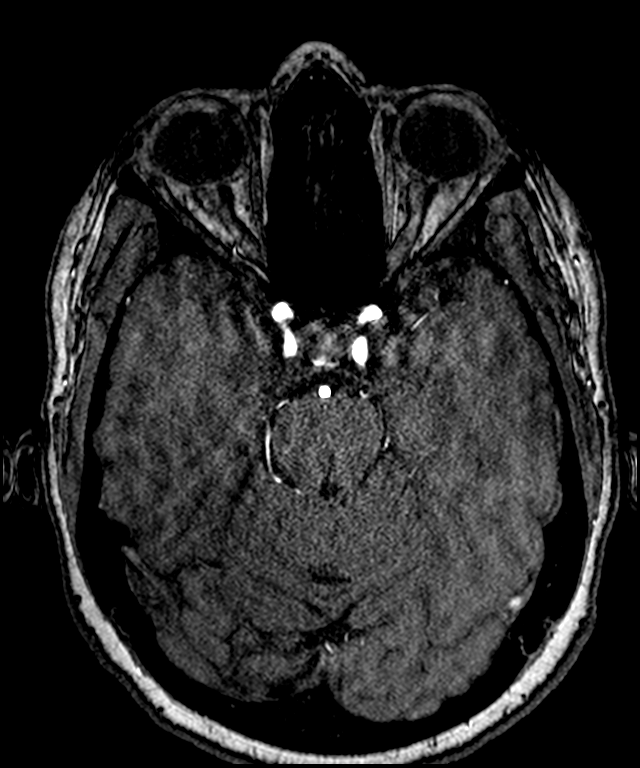
[im 97/162]
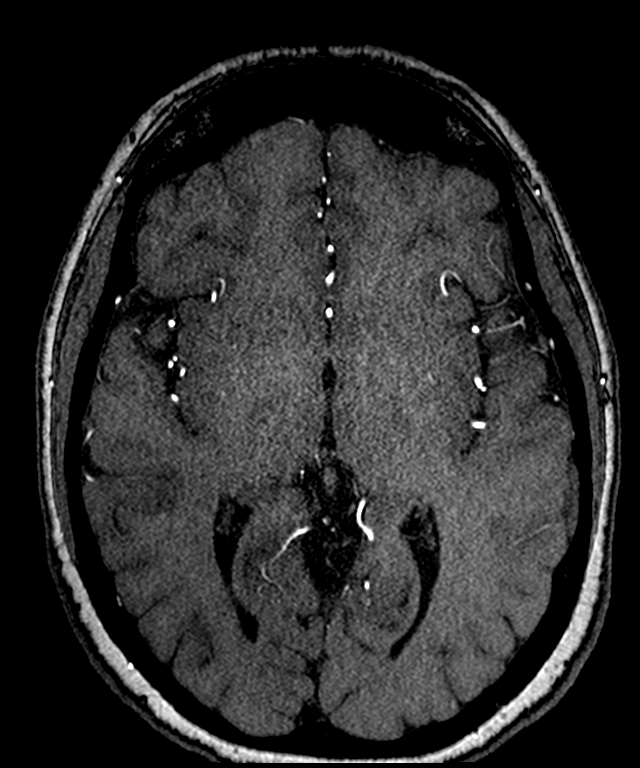
[im 129/162]
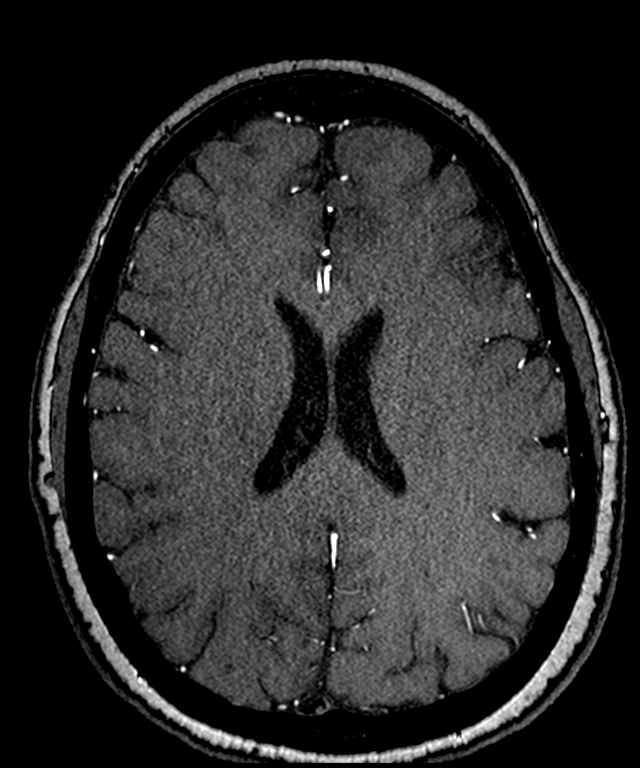

[Series 13: DWI · axial · 3.0mm · 0.94mm/px · z∈[-43,+96]mm · 6 of 159 slices shown (1 of 3)]
[im 1/159]
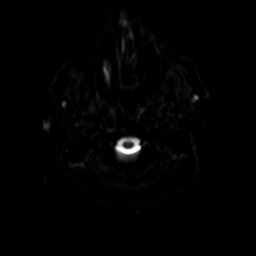
[im 32/159]
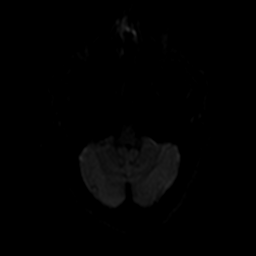
[im 64/159]
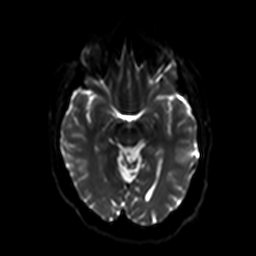
[im 95/159]
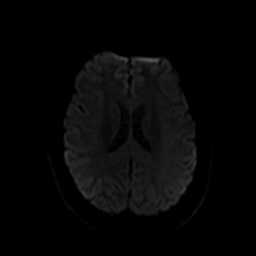
[im 127/159]
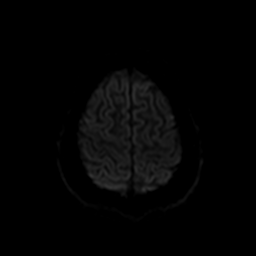
[im 159/159]
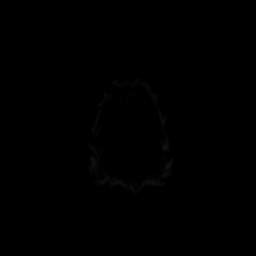

[Series 17: DWI · coronal · 5.0mm · 1.44mm/px · 2 of 62 slices shown (2 of 3)]
[im 1/62]
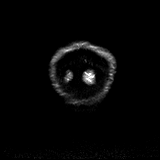
[im 62/62]
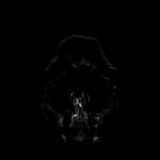

[Series 18: DWI · coronal · 5.0mm · 1.44mm/px · 1 of 31 slices shown (3 of 3)]
[im 1/31]
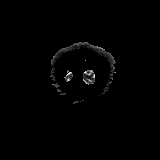

[Series 19: T2 · axial · 4.0mm · 0.36mm/px · 1 of 29 slices shown (1 of 2)]
[im 1/29]
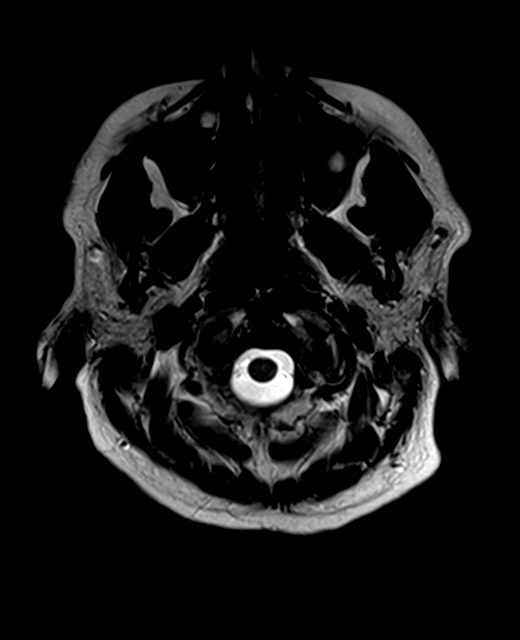

[Series 20: FLAIR · axial · 3.0mm · 0.72mm/px · 1 of 26 slices shown]
[im 1/26]
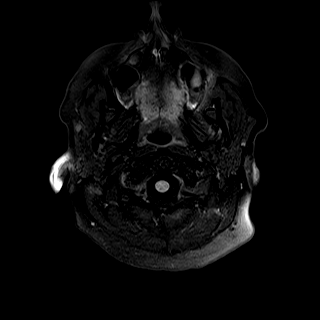

[Series 23: T1 · axial · 1.0mm · 0.94mm/px · z∈[-58,+101]mm · 6 of 160 slices shown (2 of 2)]
[im 1/160]
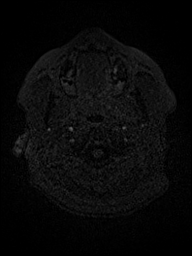
[im 32/160]
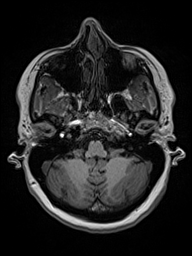
[im 64/160]
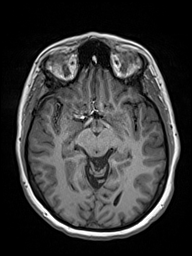
[im 96/160]
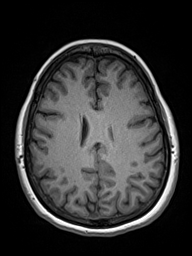
[im 128/160]
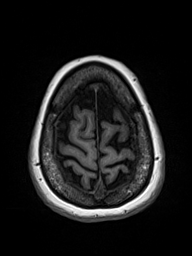
[im 160/160]
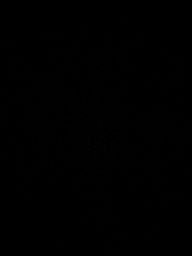

[Series 24: T2 · coronal · 4.5mm · 0.36mm/px · 1 of 30 slices shown (2 of 2)]
[im 1/30]
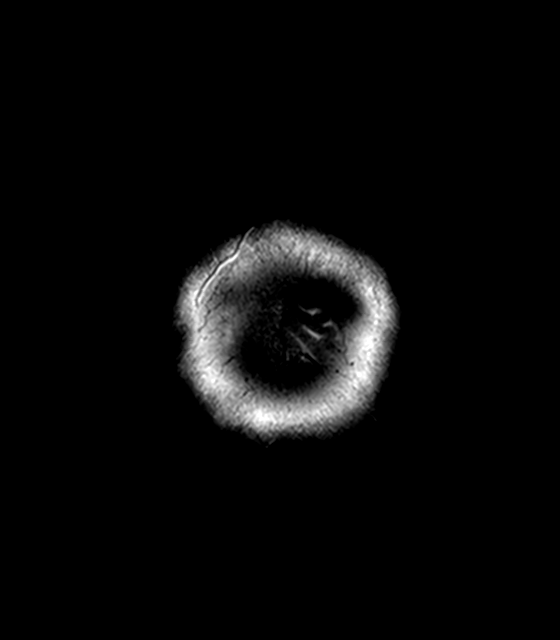

[24 of 48 positions shown; findings below may reference images not displayed]

FINDINGS: MRI HEAD FINDINGS

Brain: Cerebral volume within normal limits for patient age. No
focal parenchymal signal abnormality identified.

No abnormal foci of restricted diffusion to suggest acute or
subacute ischemia. Gray-white matter differentiation well
maintained. No encephalomalacia to suggest chronic infarction. No
foci of susceptibility artifact to suggest acute or chronic
intracranial hemorrhage.

No mass lesion, midline shift or mass effect. No hydrocephalus. No
extra-axial fluid collection.

Pituitary gland and suprasellar region are normal. Midline
structures intact and normal.

Vascular: Major intracranial vascular flow voids well maintained and
normal in appearance.

Skull and upper cervical spine: Craniocervical junction normal.
Visualized upper cervical spine within normal limits. Bone marrow
signal intensity normal. No scalp soft tissue abnormality.

Sinuses/Orbits: Globes and orbital soft tissues within normal
limits.

Minimal mucosal thickening noted within the ethmoidal air cells. Few
small retention cyst noted within the maxillary sinuses bilaterally.
Paranasal sinuses are otherwise clear. No significant mastoid
effusion. Inner ear structures grossly within normal limits.

Other: None.

MRA HEAD FINDINGS

Anterior circulation: Visualized distal cervical segments of the
internal carotid arteries are patent with antegrade flow. Petrous,
cavernous, and supraclinoid segments widely patent without stenosis
or other abnormality. Strongly dominant right A1 segment widely
patent. Left A1 markedly hypoplastic and not well seen, accounting
for the slightly diminutive left ICA is compared to the right.
Anterior communicating artery complex within normal limits. Apparent
tiny somewhat linear 2 mm outpouching extending from the left
posterior aspect of the anterior communicating artery complex
favored to reflect a small vascular infundibulum related to the
hypoplastic left A1 segment (series 9, image 78). A2 and A3 segments
widely patent distally without abnormality. No M1 stenosis or
occlusion. Left M1 bifurcates early. Normal MCA bifurcations. Distal
MCA branches well perfused and symmetric.

Posterior circulation: Vertebral arteries widely patent to the
vertebrobasilar junction without stenosis. Left vertebral artery
slightly dominant. Both PICA origins patent and normal. Basilar
widely patent to its distal aspect without stenosis. Superior
cerebellar arteries patent bilaterally. Both PCAs primarily supplied
via the basilar well perfused or distal aspects.

Anatomic variants: Hypoplastic left A1 segment. No intracranial
aneurysm or other vascular abnormality.

MRA NECK FINDINGS

Aortic arch: Visualized aortic arch normal in caliber with normal
branch pattern. No stenosis or other abnormality about the origin of
the great vessels.

Right carotid system: Right common and internal carotid arteries
widely patent without stenosis, evidence for dissection or
occlusion.

Left carotid system: Left common and internal carotid arteries
widely patent without stenosis, evidence for dissection or
occlusion.

Vertebral arteries: Both vertebral arteries arise from the
subclavian arteries. No proximal subclavian artery stenosis. Left
vertebral artery slightly dominant. Both vertebral arteries widely
patent without stenosis, evidence for dissection or occlusion.

Other: None
IMPRESSION: 1. Normal brain MRI. No findings to explain patient's symptoms
identified.
2. Normal intracranial MRA. No aneurysm or other vascular
abnormality.
3. Normal MRA of the neck.
